# Patient Record
Sex: Female | Born: 1985
Health system: Southern US, Community
[De-identification: ages and names within clinical notes are randomized; demographics above are authoritative.]

## PROBLEM LIST (undated history)

## (undated) DIAGNOSIS — I10 Essential (primary) hypertension: Secondary | ICD-10-CM

## (undated) DIAGNOSIS — U071 COVID-19: Secondary | ICD-10-CM

## (undated) DIAGNOSIS — J45909 Unspecified asthma, uncomplicated: Secondary | ICD-10-CM

## (undated) HISTORY — PX: TOOTH EXTRACTION: SUR596

---

## 2012-09-22 ENCOUNTER — Encounter (HOSPITAL_COMMUNITY): Payer: Self-pay | Admitting: *Deleted

## 2012-09-22 ENCOUNTER — Emergency Department (HOSPITAL_COMMUNITY)
Admission: EM | Admit: 2012-09-22 | Discharge: 2012-09-22 | Disposition: A | Discharge status: Home / Self Care | Payer: Self-pay | Attending: Emergency Medicine | Admitting: Emergency Medicine

## 2012-09-22 DIAGNOSIS — R300 Dysuria: Secondary | ICD-10-CM | POA: Insufficient documentation

## 2012-09-22 DIAGNOSIS — R109 Unspecified abdominal pain: Secondary | ICD-10-CM | POA: Insufficient documentation

## 2012-09-22 DIAGNOSIS — N390 Urinary tract infection, site not specified: Secondary | ICD-10-CM | POA: Insufficient documentation

## 2012-09-22 DIAGNOSIS — Z3202 Encounter for pregnancy test, result negative: Secondary | ICD-10-CM | POA: Insufficient documentation

## 2012-09-22 LAB — URINALYSIS, ROUTINE W REFLEX MICROSCOPIC
Nitrite: NEGATIVE
Protein, ur: 30 mg/dL — AB
Specific Gravity, Urine: 1.015 (ref 1.005–1.030)
Urobilinogen, UA: 1 mg/dL (ref 0.0–1.0)

## 2012-09-22 LAB — URINE MICROSCOPIC-ADD ON

## 2012-09-22 LAB — POCT PREGNANCY, URINE: Preg Test, Ur: NEGATIVE

## 2012-09-22 MED ORDER — CEFTRIAXONE SODIUM 1 G IJ SOLR
1.0000 g | Freq: Once | INTRAMUSCULAR | Status: AC
  Administered 2012-09-22: 1 g via INTRAMUSCULAR
  Filled 2012-09-22: qty 10

## 2012-09-22 MED ORDER — CEPHALEXIN 500 MG PO CAPS: 500.0000 mg | ORAL_CAPSULE | Freq: Two times a day (BID) | ORAL | Status: DC

## 2012-09-22 NOTE — ED Provider Notes (Signed)
History     CSN: [id number]  Arrival date & time 09/22/12  4098   First MD Initiated Contact with Patient 09/22/12 [phone]      No chief complaint on file.   (Consider location/radiation/quality/duration/timing/severity/associated sxs/prior treatment) HPI  Ann Mcmillan is a 27 y.o. female complaining of urinary frequency associated with dysuria worsening over the course of 10 days. Patient states that she has right flank pain onset this a.m. She denies fever, nausea or vomiting. Rates the pain as moderate, 6/10, not  exacerbated by palpation or movement.  No past medical history on file.  No past surgical history on file.  No family history on file.  History  Substance Use Topics  . Smoking status: Not on file  . Smokeless tobacco: Not on file  . Alcohol Use: Not on file    OB History   No data available      Review of Systems  Constitutional: Negative for fever.  Respiratory: Negative for shortness of breath.   Cardiovascular: Negative for chest pain.  Gastrointestinal: Negative for nausea, vomiting, abdominal pain and diarrhea.  Genitourinary: Positive for dysuria, frequency and flank pain.  All other systems reviewed and are negative.    Allergies  Review of patient's allergies indicates not on file.  Home Medications  No current outpatient prescriptions on file.  There were no vitals taken for this visit.  Physical Exam  Nursing note and vitals reviewed. Constitutional: She is oriented to person, place, and time. She appears well-developed and well-nourished. No distress.  HENT:  Head: Normocephalic.  Mouth/Throat: Oropharynx is clear and moist.  Eyes: Conjunctivae and EOM are normal. Pupils are equal, round, and reactive to light.  Neck: Normal range of motion.  Cardiovascular: Normal rate.   Pulmonary/Chest: Effort normal and breath sounds normal. No stridor. No respiratory distress. She has no wheezes. She has no rales. She exhibits no tenderness.   Abdominal: Soft. Bowel sounds are normal. She exhibits no distension and no mass. There is no tenderness. There is no rebound and no guarding.  Genitourinary:  No CVA tenderness bilaterally  Musculoskeletal: Normal range of motion.  Neurological: She is alert and oriented to person, place, and time.  Psychiatric: She has a normal mood and affect.    ED Course  Procedures (including critical care time)  Labs Reviewed  URINALYSIS, ROUTINE W REFLEX MICROSCOPIC - Abnormal; Notable for the following:    APPearance CLOUDY (*)    Hgb urine dipstick TRACE (*)    Protein, ur 30 (*)    Leukocytes, UA LARGE (*)    All other components within normal limits  URINE MICROSCOPIC-ADD ON - Abnormal; Notable for the following:    Bacteria, UA FEW (*)    All other components within normal limits  URINE CULTURE  POCT PREGNANCY, URINE   No results found.   1. UTI (lower urinary tract infection)       MDM   Filed Vitals:   09/22/12 0903  BP: 127/79  Pulse: 102  Temp: 98.6 F (37 C)  Resp: 20  SpO2: 100%     Ann Mcmillan is a 27 y.o. female  with over a week of urinary frequency and dysuria. Patient has no CVA tenderness, she is tolerating by mouth, no concern for pyelonephritis at this point. Urinalysis does appear to be infected. Culture sent, patient will be given a gram of Rocephin and sent home on Keflex. Return precautions for pyelo given.    Medications  cefTRIAXone (ROCEPHIN) injection 1  g (not administered)    The patient is hemodynamically stable, appropriate for, and amenable to, discharge at this time. Pt verbalized understanding and agrees with care plan. Outpatient follow-up and return precautions given.    New Prescriptions   CEPHALEXIN (KEFLEX) 500 MG CAPSULE    Take 1 capsule (500 mg total) by mouth 2 (two) times daily.           Wynetta Emery, PA-C 09/22/12 1002

## 2012-09-22 NOTE — ED Notes (Signed)
Patient states urinary frequency x 1 week, denies pain or burning

## 2012-09-24 LAB — URINE CULTURE: Colony Count: >=100000

## 2012-09-24 NOTE — ED Provider Notes (Signed)
Medical screening examination/treatment/procedure(s) were performed by non-physician practitioner and as supervising physician I was immediately available for consultation/collaboration.   Oneda Duffett E Mahi Zabriskie, MD 09/24/12 1108 

## 2012-09-25 ENCOUNTER — Telehealth (HOSPITAL_COMMUNITY): Payer: Self-pay | Admitting: Emergency Medicine

## 2012-09-25 NOTE — ED Notes (Signed)
Post ED Visit - Positive Culture Follow-up  Culture report reviewed by antimicrobial stewardship pharmacist: [x]  Marlou Sa, Pharm.D., BCPS []  Celedonio Miyamoto, [address].D., BCPS []  Georgina Pillion, Pharm.D., BCPS []  [place], [place].D., BCPS, AAHIVP []  Estella Husk, Pharm.D., BCPS, AAHIVP  Positive urine culture Treated with Keflex, organism sensitive to the same and no further patient follow-up is required at this time.  Kylie A Holland 09/25/2012, 5:19 PM

## 2012-10-24 ENCOUNTER — Emergency Department (HOSPITAL_COMMUNITY)
Admission: EM | Admit: 2012-10-24 | Discharge: 2012-10-24 | Disposition: A | Discharge status: Home / Self Care | Payer: Medicaid Other | Attending: Emergency Medicine | Admitting: Emergency Medicine

## 2012-10-24 ENCOUNTER — Encounter (HOSPITAL_COMMUNITY): Payer: Self-pay | Admitting: *Deleted

## 2012-10-24 DIAGNOSIS — F17200 Nicotine dependence, unspecified, uncomplicated: Secondary | ICD-10-CM | POA: Insufficient documentation

## 2012-10-24 DIAGNOSIS — K0889 Other specified disorders of teeth and supporting structures: Secondary | ICD-10-CM

## 2012-10-24 DIAGNOSIS — R599 Enlarged lymph nodes, unspecified: Secondary | ICD-10-CM | POA: Insufficient documentation

## 2012-10-24 DIAGNOSIS — K0510 Chronic gingivitis, plaque induced: Secondary | ICD-10-CM

## 2012-10-24 DIAGNOSIS — K029 Dental caries, unspecified: Secondary | ICD-10-CM

## 2012-10-24 DIAGNOSIS — K0500 Acute gingivitis, plaque induced: Secondary | ICD-10-CM | POA: Insufficient documentation

## 2012-10-24 DIAGNOSIS — K047 Periapical abscess without sinus: Secondary | ICD-10-CM | POA: Insufficient documentation

## 2012-10-24 DIAGNOSIS — K089 Disorder of teeth and supporting structures, unspecified: Secondary | ICD-10-CM | POA: Insufficient documentation

## 2012-10-24 MED ORDER — PENICILLIN V POTASSIUM 500 MG PO TABS: 500.0000 mg | ORAL_TABLET | Freq: Four times a day (QID) | ORAL | Status: AC

## 2012-10-24 MED ORDER — HYDROCODONE-ACETAMINOPHEN 5-325 MG PO TABS: 1.0000 | ORAL_TABLET | ORAL | Status: DC | PRN

## 2012-10-24 MED ORDER — HYDROCODONE-ACETAMINOPHEN 5-325 MG PO TABS
2.0000 | ORAL_TABLET | Freq: Once | ORAL | Status: AC
  Administered 2012-10-24: 2 via ORAL
  Filled 2012-10-24: qty 2

## 2012-10-24 NOTE — ED Provider Notes (Signed)
History    CSN: [id number] Arrival date & time 10/24/12  0000  First MD Initiated Contact with Patient 10/24/12 0012     Chief Complaint  Patient presents with  . Dental Pain   HPI  History provided by the patient. The patient is a 27 year old female with no significant PMH who is a current every day smoker presenting with complaints of worsened right upper molar dental pain. Patient reports history of intermittent dental pains and problems for many years. Over the last week however she has had gradually worsening and persistent pains primarily to the right upper molar. She feels some associated swelling to the cheek and facial area. She has been using Orajel which initially helped and over-the-counter ibuprofen but this has stopped working. She denies any fever, chills or sweats. No difficulty swallowing or breathing. No other aggravating or alleviating factors. No other associated symptoms.    History reviewed. No pertinent past medical history. History reviewed. No pertinent past surgical history. No family history on file. History  Substance Use Topics  . Smoking status: Current Every Day Smoker  . Smokeless tobacco: Not on file  . Alcohol Use: No   OB History   Grav Para Term Preterm Abortions TAB SAB Ect Mult Living                 Review of Systems  Constitutional: Negative for fever, chills and diaphoresis.  HENT: Positive for dental problem. Negative for sore throat and trouble swallowing.   All other systems reviewed and are negative.    Allergies  Review of patient's allergies indicates no known allergies.  Home Medications   Current Outpatient Rx  Name  Route  Sig  Dispense  Refill  . ibuprofen (ADVIL,MOTRIN) 200 MG tablet   Oral   Take 800 mg by mouth every 6 (six) hours as needed for pain.         Marland Kitchen HYDROcodone-acetaminophen (NORCO) 5-325 MG per tablet   Oral   Take 1 tablet by mouth every 4 (four) hours as needed for pain.   10 tablet   0   .  penicillin v potassium (VEETID) 500 MG tablet   Oral   Take 1 tablet (500 mg total) by mouth 4 (four) times daily.   40 tablet   0    BP 140/88  Pulse 97  Temp(Src) 98.6 F (37 C)  Resp 22  SpO2 98% Physical Exam  Nursing note and vitals reviewed. Constitutional: She is oriented to person, place, and time. She appears well-developed and well-nourished. No distress.  HENT:  Head: Normocephalic.  Multiple dental caries without with dental decay primarily to the molar teeth. There is erythema and slight edema around the gums of the right upper molar teeth with significant decay and tenderness to percussion of the right upper second molar. We'll dental caries throughout. No swelling of the tongue. Oropharynx clear.  Neck: Normal range of motion. Neck supple.  Right-sided anterior cervical lymphadenopathy  Cardiovascular: Normal rate and regular rhythm.   Pulmonary/Chest: Effort normal and breath sounds normal. No respiratory distress. She has no wheezes. She has no rales.  Abdominal: Soft.  Musculoskeletal: Normal range of motion.  Lymphadenopathy:    She has cervical adenopathy.  Neurological: She is alert and oriented to person, place, and time.  Skin: Skin is warm and dry. No rash noted.  Psychiatric: She has a normal mood and affect. Her behavior is normal.    ED Course  Procedures  1. Pain, dental   2. Periapical abscess   3. Gingivitis   4. Dental caries     MDM  1:20 AM patient seen and evaluated. Patient holding right jaw and face appears uncomfortable but in no acute distress.  Angus Seller, PA-C 10/24/12 [phone]

## 2012-10-24 NOTE — ED Provider Notes (Signed)
Medical screening examination/treatment/procedure(s) were performed by non-physician practitioner and as supervising physician I was immediately available for consultation/collaboration.    Christopher J. Pollina, MD 10/24/12 0340 

## 2012-10-24 NOTE — ED Notes (Signed)
Toothache since last thursday

## 2013-08-31 ENCOUNTER — Emergency Department (HOSPITAL_COMMUNITY)
Admission: EM | Admit: 2013-08-31 | Discharge: 2013-08-31 | Disposition: A | Discharge status: Home / Self Care | Payer: Medicaid Other | Attending: Emergency Medicine | Admitting: Emergency Medicine

## 2013-08-31 ENCOUNTER — Encounter (HOSPITAL_COMMUNITY): Payer: Self-pay | Admitting: Emergency Medicine

## 2013-08-31 DIAGNOSIS — N390 Urinary tract infection, site not specified: Secondary | ICD-10-CM

## 2013-08-31 DIAGNOSIS — K002 Abnormalities of size and form of teeth: Secondary | ICD-10-CM | POA: Insufficient documentation

## 2013-08-31 DIAGNOSIS — K029 Dental caries, unspecified: Secondary | ICD-10-CM | POA: Insufficient documentation

## 2013-08-31 DIAGNOSIS — H9209 Otalgia, unspecified ear: Secondary | ICD-10-CM | POA: Insufficient documentation

## 2013-08-31 DIAGNOSIS — K089 Disorder of teeth and supporting structures, unspecified: Secondary | ICD-10-CM | POA: Insufficient documentation

## 2013-08-31 DIAGNOSIS — F17200 Nicotine dependence, unspecified, uncomplicated: Secondary | ICD-10-CM | POA: Insufficient documentation

## 2013-08-31 DIAGNOSIS — K0889 Other specified disorders of teeth and supporting structures: Secondary | ICD-10-CM

## 2013-08-31 DIAGNOSIS — K0381 Cracked tooth: Secondary | ICD-10-CM | POA: Insufficient documentation

## 2013-08-31 LAB — URINE MICROSCOPIC-ADD ON

## 2013-08-31 LAB — URINALYSIS, ROUTINE W REFLEX MICROSCOPIC
Bilirubin Urine: NEGATIVE
GLUCOSE, UA: NEGATIVE mg/dL
Ketones, ur: NEGATIVE mg/dL
Nitrite: NEGATIVE
Protein, ur: NEGATIVE mg/dL
SPECIFIC GRAVITY, URINE: 1.008 (ref 1.005–1.030)
UROBILINOGEN UA: 0.2 mg/dL (ref 0.0–1.0)
pH: 6 (ref 5.0–8.0)

## 2013-08-31 MED ORDER — PENICILLIN V POTASSIUM 250 MG PO TABS
500.0000 mg | ORAL_TABLET | Freq: Once | ORAL | Status: AC
  Administered 2013-08-31: 500 mg via ORAL
  Filled 2013-08-31: qty 2

## 2013-08-31 MED ORDER — PENICILLIN V POTASSIUM 500 MG PO TABS: 500.0000 mg | ORAL_TABLET | Freq: Four times a day (QID) | ORAL | Status: DC

## 2013-08-31 MED ORDER — HYDROCODONE-ACETAMINOPHEN 5-325 MG PO TABS
2.0000 | ORAL_TABLET | Freq: Once | ORAL | Status: AC
  Administered 2013-08-31: 2 via ORAL
  Filled 2013-08-31: qty 2

## 2013-08-31 MED ORDER — FLUCONAZOLE 150 MG PO TABS: 150.0000 mg | ORAL_TABLET | Freq: Once | ORAL | Status: DC

## 2013-08-31 MED ORDER — SULFAMETHOXAZOLE-TRIMETHOPRIM 800-160 MG PO TABS: 1.0000 | ORAL_TABLET | Freq: Two times a day (BID) | ORAL | Status: DC

## 2013-08-31 MED ORDER — PHENAZOPYRIDINE HCL 200 MG PO TABS: 200.0000 mg | ORAL_TABLET | Freq: Three times a day (TID) | ORAL | Status: DC

## 2013-08-31 MED ORDER — HYDROCODONE-ACETAMINOPHEN 5-325 MG PO TABS: 1.0000 | ORAL_TABLET | ORAL | Status: DC | PRN

## 2013-08-31 NOTE — ED Provider Notes (Signed)
Medical screening examination/treatment/procedure(s) were performed by non-physician practitioner and as supervising physician I was immediately available for consultation/collaboration.   EKG Interpretation None        Junius ArgyleForrest S Avante Carneiro, MD 08/31/13 2011

## 2013-08-31 NOTE — Discharge Instructions (Signed)
Take Veetid as directed for your dental pain. Take vicodin as needed for pain. Take Bactrim as needed for UTI. Take pyridium as needed for bladder pain. Take Diflucan for your yeast infection. Follow up with a dentist from the resource guide below.    Emergency Department Resource Guide 1) Find a Doctor and Pay Out of Pocket Although you won't have to find out who is covered by your insurance plan, it is a good idea to ask around and get recommendations. You will then need to call the office and see if the doctor you have chosen will accept you as a new patient and what types of options they offer for patients who are self-pay. Some doctors offer discounts or will set up payment plans for their patients who do not have insurance, but you will need to ask so you aren't surprised when you get to your appointment.  2) Contact Your Local Health Department Not all health departments have doctors that can see patients for sick visits, but many do, so it is worth a call to see if yours does. If you don't know where your local health department is, you can check in your phone book. The CDC also has a tool to help you locate your state's health department, and many state websites also have listings of all of their local health departments.  3) Find a Walk-in Clinic If your illness is not likely to be very severe or complicated, you may want to try a walk in clinic. These are popping up all over the country in pharmacies, drugstores, and shopping centers. They're usually staffed by nurse practitioners or physician assistants that have been trained to treat common illnesses and complaints. They're usually fairly quick and inexpensive. However, if you have serious medical issues or chronic medical problems, these are probably not your best option.  No Primary Care Doctor: - Call Health Connect at  [phone] - they can help you locate a primary care doctor that  accepts your insurance, provides certain services,  etc. - Physician Referral Service- [phone]  Chronic Pain Problems: Organization         Address  Phone   Notes  [facility]  [phone] Patients need to be referred by their primary care doctor.   Medication Assistance: Organization         Address  Phone   Notes  [facility] [address] [place], [place] [address] [phone] --Must be a resident of [place] -- Must have NO insurance coverage whatsoever (no Medicaid/ Medicare, etc.) -- The pt. MUST have a primary care doctor that directs their care regularly and follows them in the community   MedAssist  [phone]   [employer]  [phone]    Agencies that provide inexpensive medical care: Organization         Address  Phone   Notes  [facility]  [phone]   [facility]    [phone]   [facility] [address] [place], [place] [address] [phone]   [facility] [address], [place] [phone]   Planned Parenthood    [phone]   [facility]    [phone]   [facility]  [address], [place] Phone:  [phone], Fax:  [phone] Hours of Operation:  9 am - 6 pm, M-F.  Also accepts Medicaid/Medicare and self-pay.  [facility] for [place] [address] [place], [address], [place] Phone: [phone], Fax: [phone]. Hours of Operation:  8:30 am - 5:30 pm, M-F.  Also accepts Medicaid and self-pay.  [facility] [place] [address] [place] Phone: [phone]   [place], [place], [place] [phone], Ext. [phone] Mondays & Thursdays: 7-9 AM.  First 15 patients are seen on a first come, first serve basis.    [place] Providers:  Organization         Address  Phone   Notes  [facility] [address],  [phone] Also accepts self-pay patients.  [facility] [address], [place]  [phone]   [place], [address], [place] [phone]   [facility] [address] [place] [phone]   Lucianne Lei [address], [place]   [phone] Only accepts [place] Access [place] patients after they have their name applied to their card.   Self-Pay (no insurance) in [facility]:  Organization         Address  Phone   Notes  Sickle Cell Patients, [facility] [place] [phone]   [facility] [place] [phone]   [facility] [place]  [place], [facility], [place] [phone]   [facility]/Dr. Osei-Bonsu  [address], [place] or [address], [place] [phone] Phone number for both [place] and [place] locations is the same.  Urgent Medical and [facility] [address] [place] [phone]   [facility] [address] [place] or [address] [phone]   [facility] [address], [place] [phone], phone; [phone], fax Sees patients 1st and 3rd Saturday of every month.  Must not qualify for public or private insurance (i.e. Medicaid, Medicare, Becker Health Choice, Veterans' Benefits)  Household income should be no more than 200% of the poverty level The clinic cannot treat you if you are pregnant or think you are pregnant  Sexually transmitted diseases are not treated at the clinic.    Dental Care: Organization         Address  Phone  Notes  [facility] Department of [facility] [phone] Accepts children up to  age 51 who are enrolled in [place] or [place]; pregnant women with a Medicaid card; and children who have applied for Medicaid or [place] Health Choice, but were declined, whose parents can pay a reduced fee at time of service.  [facility] Department of [facility]  [address], [place] [phone] Accepts children up to age 24 who are enrolled in [place] or [place]; pregnant women with a Medicaid card; and children who have applied for Medicaid or [place] Health Choice, but were declined, whose parents can pay a reduced fee at time of service.  [place] Adult Dental Access PROGRAM  [place] [phone] Patients are seen by appointment only. Walk-ins are not accepted. [place] will see patients 54 years of age and older. Monday - Tuesday (  8am-5pm) Most Wednesdays (8:30-5pm) $30 per visit, cash only  [facility] Adult Dental Access PROGRAM  [address], [facility] [phone] Patients are seen by appointment only. Walk-ins are not accepted. [place] will see patients 2 years of age and older. One Wednesday Evening (Monthly: Volunteer Based).  $30 per visit, cash only  [place]  [phone] for adults; Children under age 70, call Graduate Pediatric Dentistry at [phone]. Children aged 26-14, please call [phone] to request a pediatric application.  Dental services are provided in all areas of dental care including fillings, crowns and bridges, complete and partial dentures, implants, gum treatment, root canals, and extractions. Preventive care is also provided. Treatment is provided to both adults and children. Patients are selected via a lottery and there is often a waiting list.   [facility] [address], [place]  [phone] [email or web address]   [facility] [address] [place], [place] [phone], Ext. [phone] Second and Fourth Thursday of  each month, opens at 6:30 AM; Clinic ends at 9 AM.  Patients are seen on a first-come first-served basis, and a limited number are seen during each clinic.   [facility]  [address] Hillard Danker [place], [place] [phone]   Eligibility Requirements You must have lived in [place], [place], or [place] counties for at least the last three months.   You cannot be eligible for state or federal sponsored [employer], including [employer], [place], or [employer].   You generally cannot be eligible for healthcare insurance through your employer.    How to apply: Eligibility screenings are held every Tuesday and Wednesday afternoon from 1:00 pm until 4:00 pm. You do not need an appointment for the interview!  [facility] [address], [place], [place]   [place]  Grants Department  [place]  [phone]    Behavioral Health Resources in the Community: Intensive Outpatient Programs Organization         Address  Phone  Notes  [place]. [address] [place], [place] [phone]   [facility] Outpatient [address], [place], [place]   ADS: Alcohol & Drug Svcs [address] [place], [place]   [place] [address],  [place], [place] or [phone]   Substance Abuse Resources Organization         Address  Phone  Notes  Alcohol and Drug Services  [phone]   [place]  [phone]   [facility] [place]   Chinita Pester  [phone]   Residential & Outpatient Substance Abuse Program  [phone]   Psychological Services Organization         Address  Phone  Notes  [facility] [place]  [place]  [phone]   [place] [address]  [place] or [phone]    Mobile Crisis Teams Organization         Address  Phone  Notes  [facility]atives, Mobile Crisis Care Unit  [phone]   [facility]  [address] [place], [place]   [place] [address], [place] [phone]    Self-Help/Support Groups Organization         Address  Phone  Notes  [facility] of [place] - variety of support groups  [place] Call for more information  Narcotics Anonymous (NA), [facility]aring [facility] [address], [employer] [place]  2 meetings at this location   Special educational needs teacher         Address  Phone  Notes  ASAP Residential Treatment [place],    [place]  [phone]   [facility]  [address], [place] [phone], [place], [place]   [place], [place] [phone] Admissions: 8am-3pm M-F  Incentives Substance [place] [address],    [place], [place] [phone]   [facility] [address] [place], [place], [place]   [facility] [address]  [place], [place]   [facility] Programs - Intensive Outpatient [address], [place], [place]   [facility] ([place].,  [place], [place] [phone] or [phone]   [facility]idential Treatment Services (RTS) [address] [place], [place] Accepts Medicaid  [facility] [place] [address],  [place] [phone] Substance Abuse/Addiction Treatment   [facility] Organization         Address  Phone  Notes  [facility]  [phone]   Domenic Schwab, PhD [address] Arlis [place], [place]   [phone] or [phone]   [place], [place] [phone]     [facility] [address], [place], [place] [phone] Insurance/Medicaid/sponsorship through [facility] and Families [address], [address]                                    [place], [place] [phone] [place] [address], [place] [phone]    Dr. Adele Schilder  [phone]   [facility] Dept. 1) [address], [place] 2) [place] 3)  [address], [place] [phone]  [phone]   [place] [phone] or [phone] (After Hours)

## 2013-08-31 NOTE — ED Notes (Signed)
Dysuria x 2 days and blood in ua aslo rt upper and rt lower tooth pain x 1 week

## 2013-08-31 NOTE — ED Provider Notes (Signed)
CSN: [id number]     Arrival date & time 08/31/13  0927 History  This chart was scribed for non-physician practitioner, Ann BeckKaitlyn Jonthan Leite, PA-C working with Junius ArgyleForrest S Harrison, MD by Greggory StallionKayla Andersen, ED scribe. This patient was seen in room TR05C/TR05C and the patient's care was started at 10:16 AM.   Chief Complaint  Patient presents with  . Dysuria  . Dental Pain   The history is provided by the patient. No language interpreter was used.   HPI Comments: Ann Mcmillan is a 28 y.o. female who presents to the Emergency Department complaining of gradual onset right upper and lower dental pain that started one week ago. States pain radiates into her ear. pt has had several cracked teeth in the past and never got them pulled. She has taken ibuprofen with no relief. Pt is also complaining of dysuria, frequency and hematuria that started 3-4 days ago. She has taken AZO pills with no relief. Denies fever.   History reviewed. No pertinent past medical history. History reviewed. No pertinent past surgical history. No family history on file. History  Substance Use Topics  . Smoking status: Current Every Day Smoker  . Smokeless tobacco: Not on file  . Alcohol Use: No   OB History   Grav Para Term Preterm Abortions TAB SAB Ect Mult Living                 Review of Systems  Constitutional: Negative for fever.  HENT: Positive for dental problem and ear pain.   Genitourinary: Positive for dysuria, frequency and hematuria.  All other systems reviewed and are negative.  Allergies  Review of patient's allergies indicates no known allergies.  Home Medications   Prior to Admission medications   Medication Sig Start Date End Date Taking? Authorizing Provider  HYDROcodone-acetaminophen (NORCO) 5-325 MG per tablet Take 1 tablet by mouth every 4 (four) hours as needed for pain. 10/24/12   Phill MutterPeter S Dammen, PA-C  ibuprofen (ADVIL,MOTRIN) 200 MG tablet Take 800 mg by mouth every 6 (six) hours as needed for  pain.    Historical Provider, MD   BP 156/86  Pulse 78  Temp(Src) 98.8 F (37.1 C) (Oral)  Resp 15  Ht 5\' 2"  (1.575 m)  Wt 144 lb (65.318 kg)  BMI 26.33 kg/m2  SpO2 100%  Physical Exam  Nursing note and vitals reviewed. Constitutional: She is oriented to person, place, and time. She appears well-developed and well-nourished. No distress.  HENT:  Head: Normocephalic and atraumatic.  Poor dentition. Multiple dental caries noted. Right upper molar cracked and tender to percussion. No abscess.   Eyes: EOM are normal.  Neck: Neck supple. No tracheal deviation present.  Cardiovascular: Normal rate.   Pulmonary/Chest: Effort normal. No respiratory distress.  Abdominal: Soft. There is tenderness.  Suprapubic tenderness to palpation.   Musculoskeletal: Normal range of motion.  Neurological: She is alert and oriented to person, place, and time.  Skin: Skin is warm and dry.  Psychiatric: She has a normal mood and affect. Her behavior is normal.    ED Course  Procedures (including critical care time)  DIAGNOSTIC STUDIES: Oxygen Saturation is 100% on RA, normal by my interpretation.    COORDINATION OF CARE: 10:18 AM-Discussed treatment plan which includes two antbiotics, pain medications and pyridium with pt at bedside and pt agreed to plan. Will give pt dental referrals and advised her to follow up.   Labs Review Labs Reviewed  URINALYSIS, ROUTINE W REFLEX MICROSCOPIC - Abnormal; Notable for the  following:    APPearance HAZY (*)    Hgb urine dipstick MODERATE (*)    Leukocytes, UA LARGE (*)    All other components within normal limits  URINE MICROSCOPIC-ADD ON - Abnormal; Notable for the following:    Squamous Epithelial / LPF MANY (*)    Bacteria, UA MANY (*)    All other components within normal limits    Imaging Review No results found.   EKG Interpretation None      MDM   Final diagnoses:  Pain, dental  UTI (urinary tract infection)    10:25 AM Patient will  have Bactrim for UTI and pyridium for bladder spasm. Patient will have Vicodin and Veetid for dental pain. Patient requested diflucan for frequent yeast infections with antibiotics. Vitals stable and patient afebrile. Patient advised to follow up with dentist from resource guide.   I personally performed the services described in this documentation, which was scribed in my presence. The recorded information has been reviewed and is accurate.  Ann BeckKaitlyn Donella Pascarella, PA-C 08/31/13 1027

## 2014-02-11 ENCOUNTER — Emergency Department (HOSPITAL_COMMUNITY)
Admission: EM | Admit: 2014-02-11 | Discharge: 2014-02-11 | Disposition: A | Discharge status: Home / Self Care | Payer: Medicaid Other | Attending: Emergency Medicine | Admitting: Emergency Medicine

## 2014-02-11 ENCOUNTER — Encounter (HOSPITAL_COMMUNITY): Payer: Self-pay | Admitting: Emergency Medicine

## 2014-02-11 DIAGNOSIS — K0889 Other specified disorders of teeth and supporting structures: Secondary | ICD-10-CM

## 2014-02-11 DIAGNOSIS — R51 Headache: Secondary | ICD-10-CM | POA: Insufficient documentation

## 2014-02-11 DIAGNOSIS — K088 Other specified disorders of teeth and supporting structures: Secondary | ICD-10-CM | POA: Insufficient documentation

## 2014-02-11 DIAGNOSIS — Z720 Tobacco use: Secondary | ICD-10-CM | POA: Insufficient documentation

## 2014-02-11 DIAGNOSIS — Z9889 Other specified postprocedural states: Secondary | ICD-10-CM | POA: Insufficient documentation

## 2014-02-11 MED ORDER — ONDANSETRON 4 MG PO TBDP
8.0000 mg | ORAL_TABLET | Freq: Once | ORAL | Status: AC
  Administered 2014-02-11: 8 mg via ORAL
  Filled 2014-02-11: qty 2

## 2014-02-11 MED ORDER — OXYCODONE-ACETAMINOPHEN 5-325 MG PO TABS
1.0000 | ORAL_TABLET | Freq: Once | ORAL | Status: AC
  Administered 2014-02-11: 1 via ORAL
  Filled 2014-02-11: qty 1

## 2014-02-11 MED ORDER — TRAMADOL HCL 50 MG PO TABS: 50.0000 mg | ORAL_TABLET | Freq: Four times a day (QID) | ORAL | Status: DC | PRN

## 2014-02-11 MED ORDER — PENICILLIN V POTASSIUM 500 MG PO TABS: 500.0000 mg | ORAL_TABLET | Freq: Three times a day (TID) | ORAL | Status: DC

## 2014-02-11 MED ORDER — NAPROXEN 375 MG PO TABS: 375.0000 mg | ORAL_TABLET | Freq: Two times a day (BID) | ORAL | Status: DC

## 2014-02-11 MED ORDER — IBUPROFEN 400 MG PO TABS
400.0000 mg | ORAL_TABLET | Freq: Once | ORAL | Status: AC
  Administered 2014-02-11: 400 mg via ORAL
  Filled 2014-02-11: qty 1

## 2014-02-11 NOTE — ED Notes (Signed)
C/o R upper toothache, radiates to R face ear and head. H/o similar. Reports fractured teeth and extracted teeth. Does not have dentist. Onset 3d ago. No meds in last 6 hrs. No relief with tylenol, BC or ibuprofen. (denies: vomiting, drainage or fever).

## 2014-02-11 NOTE — ED Provider Notes (Signed)
CSN: [id number]     Arrival date & time 02/11/14  [id number] History   First MD Initiated Contact with Patient 02/11/14 [phone]     Chief Complaint  Patient presents with  . Dental Pain     (Consider location/radiation/quality/duration/timing/severity/associated sxs/prior Treatment) HPI Comments: 28 year old female with history of poor dentition, smoker presents with right upper tooth ache that radiates to the right ear and head. Patient has history of fractured teeth, extracted teeth and infection. Patient does not have a dentist. Symptoms have been worsening gradually for 3 days, worse with palpation. Patient denies fever or neck stiffness.  Patient is a 28 y.o. female presenting with tooth pain. The history is provided by the patient.  Dental Pain Associated symptoms: headaches     History reviewed. No pertinent past medical history. Past Surgical History  Procedure Laterality Date  . Tooth extraction     No family history on file. History  Substance Use Topics  . Smoking status: Current Every Day Smoker  . Smokeless tobacco: Not on file  . Alcohol Use: Yes   OB History   Grav Para Term Preterm Abortions TAB SAB Ect Mult Living                 Review of Systems  HENT: Positive for dental problem.   Skin: Negative for rash.  Neurological: Positive for headaches. Negative for light-headedness.      Allergies  Review of patient's allergies indicates no known allergies.  Home Medications   Prior to Admission medications   Medication Sig Start Date End Date Taking? Authorizing Provider  naproxen (NAPROSYN) 375 MG tablet Take 1 tablet (375 mg total) by mouth 2 (two) times daily. 02/11/14   Enid SkeensJoshua M Kacy Hegna, MD  penicillin v potassium (VEETID) 500 MG tablet Take 1 tablet (500 mg total) by mouth 3 (three) times daily. 02/11/14   Enid SkeensJoshua M Luellen Howson, MD  traMADol (ULTRAM) 50 MG tablet Take 1 tablet (50 mg total) by mouth every 6 (six) hours as needed. 02/11/14   Enid SkeensJoshua M Aarron Wierzbicki, MD    BP 128/98  Pulse 76  Temp(Src) 98.2 F (36.8 C) (Oral)  Resp 24  Ht 5\' 2"  (1.575 m)  Wt 137 lb (62.143 kg)  BMI 25.05 kg/m2  SpO2 100%  LMP 02/05/2014 Physical Exam  Nursing note and vitals reviewed. Constitutional: She appears well-developed and well-nourished. No distress.  HENT:  Head: Normocephalic.  Patient has no trismus, patient has poor dentition, fractured right upper posterior molar with tenderness to gingiva superiorly, no focal abscess palpated.  Neck: Normal range of motion. Neck supple.  Pulmonary/Chest: Effort normal.    ED Course  Procedures (including critical care time) Labs Review Labs Reviewed - No data to display  Imaging Review No results found.   EKG Interpretation None      MDM   Final diagnoses:  Pain, dental   Patient with poor dentition presents with right upper tooth pain. No signs of abscess, no trismus, no signs of emergent infection. Discussed antibiotics, pain meds and follow-up with a dentist.  Results and differential diagnosis were discussed with the patient/parent/guardian. Close follow up outpatient was discussed, comfortable with the plan.   Medications  oxyCODONE-acetaminophen (PERCOCET/ROXICET) 5-325 MG per tablet 1 tablet (not administered)  ibuprofen (ADVIL,MOTRIN) tablet 400 mg (400 mg Oral Given 02/11/14 0525)  oxyCODONE-acetaminophen (PERCOCET/ROXICET) 5-325 MG per tablet 1 tablet (1 tablet Oral Given 02/11/14 0525)  ondansetron (ZOFRAN-ODT) disintegrating tablet 8 mg (8 mg Oral Given 02/11/14 0525)  Filed Vitals:   02/11/14 0429 02/11/14 0526 02/11/14 0636  BP: 138/95  128/98  Pulse: 73  76  Temp: 98.2 F (36.8 C)    TempSrc: Oral    Resp: 26  24  Height: 5\' 2"  (1.575 m)    Weight: 137 lb (62.143 kg)    SpO2: 100% 100% 100%    Final diagnoses:  Pain, dental      Enid SkeensJoshua M Theia Dezeeuw, MD 02/11/14 [phone]

## 2014-02-11 NOTE — ED Notes (Signed)
Request to have numbing medications

## 2014-02-20 ENCOUNTER — Emergency Department (HOSPITAL_COMMUNITY)
Admission: EM | Admit: 2014-02-20 | Discharge: 2014-02-20 | Disposition: A | Discharge status: Home / Self Care | Payer: Medicaid Other | Attending: Emergency Medicine | Admitting: Emergency Medicine

## 2014-02-20 ENCOUNTER — Encounter (HOSPITAL_COMMUNITY): Payer: Self-pay | Admitting: Emergency Medicine

## 2014-02-20 DIAGNOSIS — K0889 Other specified disorders of teeth and supporting structures: Secondary | ICD-10-CM

## 2014-02-20 DIAGNOSIS — K088 Other specified disorders of teeth and supporting structures: Secondary | ICD-10-CM | POA: Insufficient documentation

## 2014-02-20 DIAGNOSIS — Z720 Tobacco use: Secondary | ICD-10-CM | POA: Insufficient documentation

## 2014-02-20 MED ORDER — PENICILLIN V POTASSIUM 500 MG PO TABS: 500.0000 mg | ORAL_TABLET | Freq: Four times a day (QID) | ORAL | Status: DC

## 2014-02-20 MED ORDER — BUPIVACAINE HCL (PF) 0.5 % IJ SOLN
10.0000 mL | Freq: Once | INTRAMUSCULAR | Status: AC
  Administered 2014-02-20: 10 mL
  Filled 2014-02-20: qty 10

## 2014-02-20 MED ORDER — HYDROCODONE-ACETAMINOPHEN 5-325 MG PO TABS
2.0000 | ORAL_TABLET | Freq: Once | ORAL | Status: AC
  Administered 2014-02-20: 2 via ORAL
  Filled 2014-02-20: qty 2

## 2014-02-20 MED ORDER — HYDROCODONE-ACETAMINOPHEN 5-325 MG PO TABS: 1.0000 | ORAL_TABLET | ORAL | Status: DC | PRN

## 2014-02-20 NOTE — Discharge Instructions (Signed)
Take Vicodin as needed for pain. Take veetid as directed until gone. Refer to attached documents for more information.

## 2014-02-20 NOTE — ED Provider Notes (Signed)
CSN: [id number]     Arrival date & time 02/20/14  1328 History  This chart was scribed for non-physician practitioner, Emilia BeckKaitlyn Keanan Melander, PA-C, working with Joya Gaskinsonald W Wickline, MD by Milly JakobJohn Lee Graves, ED Scribe. The patient was seen in room TR09C/TR09C. Patient's care was started at 2:34 PM.  Chief Complaint  Patient presents with  . Dental Pain   Patient is a 28 y.o. female presenting with tooth pain. The history is provided by the patient. No language interpreter was used.  Dental Pain Location:  Upper Quality:  Aching Severity:  Severe Onset quality:  Gradual Duration:  7 days Timing:  Constant Progression:  Worsening Chronicity:  Recurrent Relieved by:  Nothing Worsened by:  Nothing tried Ineffective treatments:  NSAIDs Associated symptoms: no fever    HPI Comments: Ann Mcmillan is a 28 y.o. female who presents to the Emergency Department complaining of constant severe, right, upper, dental pain onset 1 week ago. She reports that she was seen here for a similar problem two weeks ago, and prescribed ABX, Tramadol, and Naproxen. She has been taking these medications as prescribed without relief. She reports that she is scheduled to see a dentist on November 23rd, but is requesting something to relieve her pain until then.    History reviewed. No pertinent past medical history. Past Surgical History  Procedure Laterality Date  . Tooth extraction     No family history on file. History  Substance Use Topics  . Smoking status: Current Every Day Smoker  . Smokeless tobacco: Not on file  . Alcohol Use: Yes   OB History    No data available     Review of Systems  Constitutional: Negative for fever.  HENT: Positive for dental problem.   All other systems reviewed and are negative.  Allergies  Review of patient's allergies indicates no known allergies.  Home Medications   Prior to Admission medications   Medication Sig Start Date End Date Taking? Authorizing Provider   naproxen (NAPROSYN) 375 MG tablet Take 1 tablet (375 mg total) by mouth 2 (two) times daily. 02/11/14   Enid SkeensJoshua M Zavitz, MD  penicillin v potassium (VEETID) 500 MG tablet Take 1 tablet (500 mg total) by mouth 3 (three) times daily. 02/11/14   Enid SkeensJoshua M Zavitz, MD  traMADol (ULTRAM) 50 MG tablet Take 1 tablet (50 mg total) by mouth every 6 (six) hours as needed. 02/11/14   Enid SkeensJoshua M Zavitz, MD   Triage Vitals: BP 145/87 mmHg  Pulse 76  Temp(Src) 98.2 F (36.8 C) (Oral)  Resp 17  Ht 5\' 2"  (1.575 m)  Wt 137 lb (62.143 kg)  BMI 25.05 kg/m2  SpO2 96%  LMP 02/05/2014 Physical Exam  Constitutional: She is oriented to person, place, and time. She appears well-developed and well-nourished. No distress.  HENT:  Head: Normocephalic and atraumatic.  Mouth/Throat: Oropharynx is clear and moist. No oropharyngeal exudate.  Poor dentition. Right upper molars cracked and decayed and tender to percussion. No abscess noted.   Eyes: Conjunctivae and EOM are normal.  Neck: Neck supple. No tracheal deviation present.  Cardiovascular: Normal rate.   Pulmonary/Chest: Effort normal. No respiratory distress.  Abdominal: Soft.  Musculoskeletal: Normal range of motion.  Neurological: She is alert and oriented to person, place, and time.  Skin: Skin is warm and dry.  Psychiatric: She has a normal mood and affect. Her behavior is normal.  Nursing note and vitals reviewed.   ED Course  NERVE BLOCK Date/Time: 02/20/2014 3:09 PM Performed by:  Keishaun Hazel Authorized by: Emilia BeckSZEKALSKI, Eilan Mcinerny Consent: Verbal consent obtained. Risks and benefits: risks, benefits and alternatives were discussed Consent given by: patient Patient understanding: patient states understanding of the procedure being performed Patient consent: the patient's understanding of the procedure matches consent given Patient identity confirmed: verbally with patient Time out: Immediately prior to procedure a "time out" was called to  verify the correct patient, procedure, equipment, support staff and site/side marked as required. Indications: pain relief Body area: face/mouth Nerve: posterior superior alveolar Laterality: right Patient sedated: no Preparation: Patient was prepped and draped in the usual sterile fashion. Patient position: sitting Needle gauge: 27 G Location technique: anatomical landmarks Local anesthetic: bupivacaine 0.5% without epinephrine Anesthetic total: 3 ml Outcome: pain improved Patient tolerance: Patient tolerated the procedure well with no immediate complications   (including critical care time) DIAGNOSTIC STUDIES: Oxygen Saturation is 96% on room air, normal by my interpretation.    COORDINATION OF CARE: 2:48 PM-Discussed treatment plan which includes dental block and pain medication.   Labs Review Labs Reviewed - No data to display  Imaging Review No results found.   EKG Interpretation None      MDM   Final diagnoses:  Pain, dental    3:08 PM Patient given dental block for pain relief. Patient reports improved symptoms. Patient will have vicodin and veetid for symptoms. No abscess or signs of ludwigs angina. Patient instructed to return with worsening or concerning symptoms. Vitals stable and patient afebrile.   I personally performed the services described in this documentation, which was scribed in my presence. The recorded information has been reviewed and is accurate.   Emilia BeckKaitlyn Harriette Tovey, PA-C 02/20/14 1511

## 2014-02-20 NOTE — ED Notes (Signed)
Patient states R upper tooth pain.  Patient states was recently seen for same, was unable to see dentist until November 23.   Patient states that she has appointment set up for Nov 23.   Patient complains of "too much pain, it makes my eye jump".

## 2016-07-28 ENCOUNTER — Encounter (HOSPITAL_COMMUNITY): Payer: Self-pay | Admitting: Emergency Medicine

## 2016-07-28 ENCOUNTER — Emergency Department (HOSPITAL_COMMUNITY)
Admission: EM | Admit: 2016-07-28 | Discharge: 2016-07-28 | Disposition: A | Discharge status: Home / Self Care | Payer: Medicaid Other | Attending: Emergency Medicine | Admitting: Emergency Medicine

## 2016-07-28 DIAGNOSIS — J45909 Unspecified asthma, uncomplicated: Secondary | ICD-10-CM | POA: Insufficient documentation

## 2016-07-28 DIAGNOSIS — J019 Acute sinusitis, unspecified: Secondary | ICD-10-CM | POA: Insufficient documentation

## 2016-07-28 DIAGNOSIS — F17200 Nicotine dependence, unspecified, uncomplicated: Secondary | ICD-10-CM | POA: Insufficient documentation

## 2016-07-28 HISTORY — DX: Unspecified asthma, uncomplicated: J45.909

## 2016-07-28 LAB — RAPID STREP SCREEN (MED CTR MEBANE ONLY): Streptococcus, Group A Screen (Direct): NEGATIVE

## 2016-07-28 MED ORDER — HYDROCODONE-ACETAMINOPHEN 7.5-325 MG/15ML PO SOLN: 15.0000 mL | Freq: Every evening | ORAL | 0 refills | Status: DC | PRN

## 2016-07-28 MED ORDER — AMOXICILLIN-POT CLAVULANATE 875-125 MG PO TABS
1.0000 | ORAL_TABLET | Freq: Once | ORAL | Status: AC
  Administered 2016-07-28: 1 via ORAL
  Filled 2016-07-28: qty 1

## 2016-07-28 MED ORDER — AMOXICILLIN-POT CLAVULANATE 875-125 MG PO TABS: 1.0000 | ORAL_TABLET | Freq: Two times a day (BID) | ORAL | 0 refills | Status: DC

## 2016-07-28 MED ORDER — LORATADINE 10 MG PO TABS: 10.0000 mg | ORAL_TABLET | Freq: Every day | ORAL | 0 refills | Status: DC

## 2016-07-28 NOTE — ED Provider Notes (Signed)
[facility]-EMERGENCY DEPT Provider Note   CSN: [id number] Arrival date & time: 07/28/16  0107     History   Chief Complaint Chief Complaint  Patient presents with  . Sore Throat  . Otalgia    HPI Ann Mcmillan is a 31 y.o. female.  The history is provided by the patient. No language interpreter was used.  URI   This is a new problem. The current episode started more than 1 week ago. The problem has not changed since onset.There has been no fever. Associated symptoms include congestion, plugged ear sensation (with otalgia, left), sinus pain, sore throat and swollen glands. Pertinent negatives include no abdominal pain and no vomiting. Treatments tried: Dayquil, Nyquil, saline spray. The treatment provided no relief.    Past Medical History:  Diagnosis Date  . Asthma     There are no active problems to display for this patient.   Past Surgical History:  Procedure Laterality Date  . TOOTH EXTRACTION      OB History    No data available       Home Medications    Prior to Admission medications   Medication Sig Start Date End Date Taking? Authorizing Provider  amoxicillin-clavulanate (AUGMENTIN) 875-125 MG tablet Take 1 tablet by mouth every 12 (twelve) hours. 07/28/16   [name], PA-C  HYDROcodone-acetaminophen (HYCET) 7.5-325 mg/15 ml solution Take 15 mLs by mouth at bedtime as needed for moderate pain or severe pain (sore throat). 07/28/16   [name], PA-C  loratadine (CLARITIN) 10 MG tablet Take 1 tablet (10 mg total) by mouth daily. 07/28/16   [name], PA-C  naproxen (NAPROSYN) 375 MG tablet Take 1 tablet (375 mg total) by mouth 2 (two) times daily. 02/11/14   Blane Ohara, MD  penicillin v potassium (VEETID) 500 MG tablet Take 1 tablet (500 mg total) by mouth 4 (four) times daily. 02/20/14   Kaitlyn Szekalski, PA-C  traMADol (ULTRAM) 50 MG tablet Take 1 tablet (50 mg total) by mouth every 6 (six) hours as needed. 02/11/14   Blane Ohara, MD    Family History No  family history on file.  Social History Social History  Substance Use Topics  . Smoking status: Current Every Day Smoker  . Smokeless tobacco: Not on file  . Alcohol use Yes     Allergies   Patient has no known allergies.   Review of Systems Review of Systems  HENT: Positive for congestion, sinus pain and sore throat.   Gastrointestinal: Negative for abdominal pain and vomiting.  Ten systems reviewed and are negative for acute change, except as noted in the HPI.     Physical Exam Updated Vital Signs BP (!) 134/96 (BP Location: Right Arm)   Pulse 85   Temp 99 F (37.2 C) (Oral)   Resp 20   Wt 68.3 kg   SpO2 100%   BMI 27.54 kg/m   Physical Exam  Constitutional: She is oriented to person, place, and time. She appears well-developed and well-nourished. No distress.  HENT:  Head: Normocephalic and atraumatic.  Right Ear: External ear and ear canal normal. Tympanic membrane is erythematous. Tympanic membrane is not perforated. A middle ear effusion is present.  Left Ear: External ear and ear canal normal. Tympanic membrane is not perforated. A middle ear effusion is present.  Audible congestion. Bilateral nares patent. Uvula midline. Patient tolerating secretions without difficulty.  Eyes: Conjunctivae and EOM are normal. No scleral icterus.  Neck: Normal range of motion.  No nuchal rigidity or  meningismus  Cardiovascular: Normal rate and regular rhythm.   Pulmonary/Chest: Effort normal. No respiratory distress.  Respirations even and unlabored  Musculoskeletal: Normal range of motion.  Lymphadenopathy:  +anterior cervical lymphadenopathy  Neurological: She is alert and oriented to person, place, and time. She exhibits normal muscle tone. Coordination normal.  Skin: Skin is warm and dry. No rash noted. She is not diaphoretic. No erythema. No pallor.  Psychiatric: She has a normal mood and affect. Her behavior is normal.  Nursing note and vitals reviewed.    ED  Treatments / Results  Labs (all labs ordered are listed, but only abnormal results are displayed) Labs Reviewed  RAPID STREP SCREEN (NOT AT [facility])    EKG  EKG Interpretation None       Radiology No results found.  Procedures Procedures (including critical care time)  Medications Ordered in ED Medications  amoxicillin-clavulanate (AUGMENTIN) 875-125 MG per tablet 1 tablet (not administered)     Initial Impression / Assessment and Plan / ED Course  I have reviewed the triage vital signs and the nursing notes.  Pertinent labs & imaging results that were available during my care of the patient were reviewed by me and considered in my medical decision making (see chart for details).     Patient complaining of symptoms of sinusitis. Severe symptoms have been present for greater than 1 week with purulent nasal discharge and sinus pain as well as L sided otalgia. Concern for acute bacterial rhinosinusitis. Patient discharged with Augmentin. Instructions given for warm saline nasal wash and recommendations for follow-up with primary care physician.   Final Clinical Impressions(s) / ED Diagnoses   Final diagnoses:  Acute sinusitis, recurrence not specified, unspecified location    New Prescriptions New Prescriptions   AMOXICILLIN-CLAVULANATE (AUGMENTIN) 875-125 MG TABLET    Take 1 tablet by mouth every 12 (twelve) hours.   HYDROCODONE-ACETAMINOPHEN (HYCET) 7.5-325 MG/15 ML SOLUTION    Take 15 mLs by mouth at bedtime as needed for moderate pain or severe pain (sore throat).   LORATADINE (CLARITIN) 10 MG TABLET    Take 1 tablet (10 mg total) by mouth daily.     [name], PA-C 07/28/16 0149    Shon Baton, MD 07/30/16 [phone]

## 2016-07-28 NOTE — ED Triage Notes (Signed)
Pt here for URI symptoms, sore throat and bilateral otalgia for over 1 week. Pt denies fevers at home. States last medication today was nyquil tonight

## 2016-07-29 LAB — CULTURE, GROUP A STREP (THRC)

## 2017-06-17 ENCOUNTER — Emergency Department (HOSPITAL_COMMUNITY)
Admission: EM | Admit: 2017-06-17 | Discharge: 2017-06-17 | Disposition: A | Discharge status: Home / Self Care | Payer: Self-pay | Attending: Emergency Medicine | Admitting: Emergency Medicine

## 2017-06-17 ENCOUNTER — Encounter (HOSPITAL_COMMUNITY): Payer: Self-pay | Admitting: Emergency Medicine

## 2017-06-17 DIAGNOSIS — J45909 Unspecified asthma, uncomplicated: Secondary | ICD-10-CM | POA: Insufficient documentation

## 2017-06-17 DIAGNOSIS — N3090 Cystitis, unspecified without hematuria: Secondary | ICD-10-CM | POA: Insufficient documentation

## 2017-06-17 DIAGNOSIS — F17200 Nicotine dependence, unspecified, uncomplicated: Secondary | ICD-10-CM | POA: Insufficient documentation

## 2017-06-17 DIAGNOSIS — Z79899 Other long term (current) drug therapy: Secondary | ICD-10-CM | POA: Insufficient documentation

## 2017-06-17 LAB — I-STAT BETA HCG BLOOD, ED (MC, WL, AP ONLY): I-stat hCG, quantitative: <5 m[IU]/mL (ref <5)

## 2017-06-17 LAB — URINALYSIS, ROUTINE W REFLEX MICROSCOPIC
Bilirubin Urine: NEGATIVE
GLUCOSE, UA: NEGATIVE mg/dL
Hgb urine dipstick: NEGATIVE
KETONES UR: NEGATIVE mg/dL
Nitrite: POSITIVE — AB
PH: 6 (ref 5.0–8.0)
Protein, ur: NEGATIVE mg/dL
SPECIFIC GRAVITY, URINE: 1.016 (ref 1.005–1.030)

## 2017-06-17 MED ORDER — CEPHALEXIN 500 MG PO CAPS: 500.0000 mg | ORAL_CAPSULE | Freq: Two times a day (BID) | ORAL | 0 refills | Status: AC

## 2017-06-17 NOTE — Discharge Instructions (Signed)
You have a urinary tract infection.  Please take antibiotic twice a day for the next week.  Please take all of your antibiotics until finished!   You may develop abdominal discomfort or diarrhea from the antibiotic.  You may help offset this with probiotics which you can buy or get in yogurt. Do not eat  or take the probiotics until 2 hours after your antibiotic.   You may continue taking Azo's for pain and cranberry juice.  Return to the emergency department if you develop fever greater than 100.4 F, vomiting that does not stop or have any new or worsening symptoms.

## 2017-06-17 NOTE — ED Provider Notes (Signed)
[facility] EMERGENCY DEPARTMENT Provider Note   CSN: [id number] Arrival date & time: 06/17/17  1557     History   Chief Complaint Chief Complaint  Patient presents with  . Urinary Frequency    HPI Ann Mcmillan is a 32 y.o. female.  HPI  Ann Mcmillan is a 32yo female with a history of asthma who presents to the emergency department for evaluation of urinary frequency, urgency and dysuria.  Patient states that her symptoms began about three days ago.  States that she has had a UTI in the past and this feels similar.  Has been taking over-the-counter Azo and drinking a lot of water without much improvement.  She also states that she had a scant amount of vaginal bleeding three days ago after taking Plan B when a condom broke while having sexual intercourse.  Has not had bleeding since. States that she also noticed some mild vaginal discharge a few days ago, declines STD testing.  She denies fevers, chills, abdominal pain, hematuria, diarrhea, nausea/vomiting, chest pain, shortness of breath.   Past Medical History:  Diagnosis Date  . Asthma     There are no active problems to display for this patient.   Past Surgical History:  Procedure Laterality Date  . TOOTH EXTRACTION      OB History    No data available       Home Medications    Prior to Admission medications   Medication Sig Start Date End Date Taking? Authorizing Provider  amoxicillin-clavulanate (AUGMENTIN) 875-125 MG tablet Take 1 tablet by mouth every 12 (twelve) hours. 07/28/16   [name], PA-C  HYDROcodone-acetaminophen (HYCET) 7.5-325 mg/15 ml solution Take 15 mLs by mouth at bedtime as needed for moderate pain or severe pain (sore throat). 07/28/16   [name], PA-C  loratadine (CLARITIN) 10 MG tablet Take 1 tablet (10 mg total) by mouth Mcmillan. 07/28/16   [name], PA-C  naproxen (NAPROSYN) 375 MG tablet Take 1 tablet (375 mg total) by mouth 2 (two) times Mcmillan. 02/11/14   Blane Ohara, MD  penicillin v potassium (VEETID) 500 MG tablet Take 1 tablet (500 mg total) by mouth 4 (four) times Mcmillan. 02/20/14   Szekalski, Kaitlyn, PA-C  traMADol (ULTRAM) 50 MG tablet Take 1 tablet (50 mg total) by mouth every 6 (six) hours as needed. 02/11/14   Blane Ohara, MD    Family History No family history on file.  Social History Social History   Tobacco Use  . Smoking status: Current Every Day Smoker  . Smokeless tobacco: Never Used  Substance Use Topics  . Alcohol use: Yes  . Drug use: Yes    Types: Marijuana     Allergies   Patient has no known allergies.   Review of Systems Review of Systems  Constitutional: Negative for chills and fever.  HENT: Negative for congestion.   Respiratory: Negative for shortness of breath.   Cardiovascular: Negative for chest pain.  Gastrointestinal: Negative for abdominal pain, nausea and vomiting.  Genitourinary: Positive for decreased urine volume, dysuria, frequency, urgency and vaginal discharge. Negative for difficulty urinating, flank pain, genital sores, hematuria and pelvic pain.  Musculoskeletal: Negative for back pain and gait problem.  Skin: Negative for rash.  Neurological: Negative for headaches.  Psychiatric/Behavioral: Negative for agitation.     Physical Exam Updated Vital Signs BP (!) 144/89 (BP Location: Right Arm)   Pulse 87   Temp 98.8 F (37.1 C) (Oral)   Resp 18  SpO2 100%   Physical Exam  Constitutional: She is oriented to person, place, and time. She appears well-developed and well-nourished. No distress.  HENT:  Head: Normocephalic and atraumatic.  Mouth/Throat: Oropharynx is clear and moist. No oropharyngeal exudate.  Eyes: Conjunctivae are normal. Pupils are equal, round, and reactive to light. Right eye exhibits no discharge. Left eye exhibits no discharge.  Neck: Normal range of motion. Neck supple.  Pulmonary/Chest: Effort normal. No respiratory distress.  Abdominal: Soft. Bowel  sounds are normal. There is no tenderness. There is no guarding.  No CVA tenderness.  Musculoskeletal: Normal range of motion.  Neurological: She is alert and oriented to person, place, and time. Coordination normal.  Skin: Skin is warm and dry. She is not diaphoretic.  Psychiatric: She has a normal mood and affect. Her behavior is normal.  Nursing note and vitals reviewed.    ED Treatments / Results  Labs (all labs ordered are listed, but only abnormal results are displayed) Labs Reviewed  URINALYSIS, ROUTINE W REFLEX MICROSCOPIC - Abnormal; Notable for the following components:      Result Value   Color, Urine AMBER (*)    APPearance HAZY (*)    Nitrite POSITIVE (*)    Leukocytes, UA LARGE (*)    Bacteria, UA MANY (*)    Squamous Epithelial / LPF 0-5 (*)    Non Squamous Epithelial 0-5 (*)    All other components within normal limits  I-STAT BETA HCG BLOOD, ED ([facility], [facility], [facility] ONLY)    EKG  EKG Interpretation None       Radiology No results found.  Procedures Procedures (including critical care time)  Medications Ordered in ED Medications - No data to display   Initial Impression / Assessment and Plan / ED Course  I have reviewed the triage vital signs and the nursing notes.  Pertinent labs & imaging results that were available during my care of the patient were reviewed by me and considered in my medical decision making (see chart for details).    Pt has been diagnosed with a UTI. Pt is afebrile, no CVA tenderness, normotensive, and denies N/V. Pt to be dc home with antibiotics and instructions to follow up with PCP if symptoms persist.   Final Clinical Impressions(s) / ED Diagnoses   Final diagnoses:  Cystitis    ED Discharge Orders        Ordered    cephALEXin (KEFLEX) 500 MG capsule  2 times Mcmillan     06/17/17 1838       Lawrence MarseillesShrosbree, Kynlee Koenigsberg J, PA-C 06/17/17 1840    Tilden Fossaees, Elizabeth, MD 06/18/17 [phone]

## 2017-06-17 NOTE — ED Triage Notes (Signed)
Pt c/o urinary frequency x 4 days. Denies discharge, pt reports some bleeding, but recently took Plan B.

## 2017-07-03 ENCOUNTER — Emergency Department (HOSPITAL_COMMUNITY)
Admission: EM | Admit: 2017-07-03 | Discharge: 2017-07-03 | Disposition: A | Discharge status: Home / Self Care | Payer: Self-pay | Attending: Emergency Medicine | Admitting: Emergency Medicine

## 2017-07-03 ENCOUNTER — Encounter (HOSPITAL_COMMUNITY): Payer: Self-pay

## 2017-07-03 DIAGNOSIS — Z79899 Other long term (current) drug therapy: Secondary | ICD-10-CM | POA: Insufficient documentation

## 2017-07-03 DIAGNOSIS — R300 Dysuria: Secondary | ICD-10-CM | POA: Insufficient documentation

## 2017-07-03 DIAGNOSIS — F17210 Nicotine dependence, cigarettes, uncomplicated: Secondary | ICD-10-CM | POA: Insufficient documentation

## 2017-07-03 DIAGNOSIS — J45909 Unspecified asthma, uncomplicated: Secondary | ICD-10-CM | POA: Insufficient documentation

## 2017-07-03 DIAGNOSIS — R030 Elevated blood-pressure reading, without diagnosis of hypertension: Secondary | ICD-10-CM | POA: Insufficient documentation

## 2017-07-03 LAB — URINALYSIS, ROUTINE W REFLEX MICROSCOPIC
BILIRUBIN URINE: NEGATIVE
GLUCOSE, UA: NEGATIVE mg/dL
Ketones, ur: NEGATIVE mg/dL
NITRITE: NEGATIVE
PH: 6 (ref 5.0–8.0)
Protein, ur: NEGATIVE mg/dL
SPECIFIC GRAVITY, URINE: 1.006 (ref 1.005–1.030)

## 2017-07-03 LAB — WET PREP, GENITAL
Sperm: NONE SEEN
Yeast Wet Prep HPF POC: NONE SEEN

## 2017-07-03 LAB — POC URINE PREG, ED: Preg Test, Ur: NEGATIVE

## 2017-07-03 MED ORDER — METRONIDAZOLE 500 MG PO TABS: 500.0000 mg | ORAL_TABLET | Freq: Two times a day (BID) | ORAL | 0 refills | Status: DC

## 2017-07-03 MED ORDER — CEFTRIAXONE SODIUM 250 MG IJ SOLR
250.0000 mg | Freq: Once | INTRAMUSCULAR | Status: AC
  Administered 2017-07-03: 250 mg via INTRAMUSCULAR
  Filled 2017-07-03: qty 250

## 2017-07-03 MED ORDER — METRONIDAZOLE 500 MG PO TABS
500.0000 mg | ORAL_TABLET | Freq: Once | ORAL | Status: AC
  Administered 2017-07-03: 500 mg via ORAL
  Filled 2017-07-03: qty 1

## 2017-07-03 MED ORDER — AZITHROMYCIN 250 MG PO TABS
1000.0000 mg | ORAL_TABLET | Freq: Once | ORAL | Status: AC
Start: 2017-07-03 — End: 2017-07-03
  Administered 2017-07-03: 1000 mg via ORAL
  Filled 2017-07-03: qty 4

## 2017-07-03 MED ORDER — ACYCLOVIR 400 MG PO TABS: 400.0000 mg | ORAL_TABLET | Freq: Three times a day (TID) | ORAL | 0 refills | Status: AC

## 2017-07-03 NOTE — Discharge Instructions (Signed)
Medications: You are treated with Flagyl today.  You will take this for 7 days.  Please do not drink alcohol while taking this, and refrain from alcohol for 2 days afterwards.  The Flagyl treats you were also treated for gonorrhea and chlamydia today.    You were also treated with valacyclovir.  You will take this to clear a herpes outbreak.  As long as you have itching and burning of the lesions on your vagina, you should not be having sexual intercourse.  If this is a herpes lesion, it will be transmitted until it is completely cleared over.  Use a condom with every sexual encounter Follow up with your doctor OBGYN in regards to today's visit.    Please return to the ER for worsening symptoms, high fevers or persistent vomiting.  You have been tested for HIV, syphilis, chlamydia and gonorrhea. These results will be available in approximately 3 days. You will be notified if they are positive.   It is very important to practice safe sex and use condoms when sexually active. If your results are positive you need to notify all sexual partners so they can be treated as well. The website https://garcia.net/http://www.dontspreadit.com/ can be used to send anonymous text messages or emails to alert sexual contacts.   SEEK IMMEDIATE MEDICAL CARE IF:  You develop an oral temperature above 102 F (38.9 C), not controlled by medications or lasting more than 2 days.  You develop an increase in pain.  You develop vaginal bleeding and it is not time for your period.  You develop painful intercourse.    For your dizziness, you may eventually need to see a primary care provider to receive a cardiac workup such as wearing a Holter monitor, getting an EKG, or getting further blood work.  In the meantime.  I recommend that she do this as soon as possible.

## 2017-07-03 NOTE — ED Notes (Signed)
PA bedside for pelvic exam accompanied by female EMT

## 2017-07-03 NOTE — ED Notes (Signed)
Patient ambulatory to bathroom with steady gait at this time 

## 2017-07-03 NOTE — ED Provider Notes (Signed)
[facility] EMERGENCY DEPARTMENT Provider Note   CSN: [id number] Arrival date & time: 07/03/17  1453     History   Chief Complaint Chief Complaint  Patient presents with  . dysuria/dizziness    HPI Ann Mcmillan is a 32 y.o. female.  HPI   Dysuria  Patient reports that she was treated a couple weeks ago for urinary tract infection, and had a couple day resolution of her feeling of urgency, but then noted that she began to have some burning at the end of her urination.  Patient reports that she purchased Monistat, which helped her symptoms but then noted that she still had a burning around her labia.  Patient then scrubbed to the labia and believes she may have irritated it.  Patient denies any copious vaginal discharge at this time, vaginal bleeding, urgency or frequency, suprapubic pain, belly pain, flank pain, nausea, or vomiting.  Patient denies any fevers.  Patient reports she is sexually active with one female partner, monogamous, for 3 months.  Patient does report that she has concerns that there could have been STI exposure from her partner.  Dizziness  I discussed her dizziness, patient reported that she has had episodes of lightheadedness at random with syncope since the eighth grade.  She wanted to discuss this while she was here.  Patient reports that she will occasionally in a sensation like her vision is closing in at random times, and sometimes has palpitations associated with this.  Patient reports it is been a few years since she had syncope.  Patient reports that she has never bit her tongue or urinated on herself, and is able to wake up fairly rapidly from these episodes.  Blank stares or tonic-clonic movements have never been observed by bystanders and these incidents.  Past Medical History:  Diagnosis Date  . Asthma     There are no active problems to display for this patient.   Past Surgical History:  Procedure Laterality Date  . TOOTH  EXTRACTION      OB History    No data available       Home Medications    Prior to Admission medications   Medication Sig Start Date End Date Taking? Authorizing Provider  amoxicillin-clavulanate (AUGMENTIN) 875-125 MG tablet Take 1 tablet by mouth every 12 (twelve) hours. 07/28/16   [name], PA-C  HYDROcodone-acetaminophen (HYCET) 7.5-325 mg/15 ml solution Take 15 mLs by mouth at bedtime as needed for moderate pain or severe pain (sore throat). 07/28/16   [name], PA-C  loratadine (CLARITIN) 10 MG tablet Take 1 tablet (10 mg total) by mouth daily. 07/28/16   [name], PA-C  naproxen (NAPROSYN) 375 MG tablet Take 1 tablet (375 mg total) by mouth 2 (two) times daily. 02/11/14   Blane Ohara, MD  penicillin v potassium (VEETID) 500 MG tablet Take 1 tablet (500 mg total) by mouth 4 (four) times daily. 02/20/14   Szekalski, Kaitlyn, PA-C  traMADol (ULTRAM) 50 MG tablet Take 1 tablet (50 mg total) by mouth every 6 (six) hours as needed. 02/11/14   Blane Ohara, MD    Family History No family history on file.  Social History Social History   Tobacco Use  . Smoking status: Current Every Day Smoker  . Smokeless tobacco: Never Used  Substance Use Topics  . Alcohol use: Yes  . Drug use: Yes    Types: Marijuana     Allergies   Patient has no known allergies.   Review of  Systems Review of Systems  Constitutional: Negative for chills and fever.  HENT: Negative for rhinorrhea and sore throat.   Respiratory: Negative for chest tightness and shortness of breath.   Cardiovascular: Negative for chest pain and palpitations.  Gastrointestinal: Negative for abdominal pain, nausea and vomiting.  Genitourinary: Positive for dysuria and vaginal pain. Negative for flank pain, pelvic pain, vaginal bleeding and vaginal discharge.  Skin: Positive for rash.  Neurological: Positive for dizziness and syncope.  All other systems reviewed and are negative.    Physical  Exam Updated Vital Signs BP (!) 149/118 (BP Location: Right Arm)   Pulse 92   Temp 98.6 F (37 C) (Oral)   Resp 20   SpO2 100%   Physical Exam  Constitutional: She appears well-developed and well-nourished. No distress.  HENT:  Head: Normocephalic and atraumatic.  Mouth/Throat: Oropharynx is clear and moist.  Eyes: Conjunctivae and EOM are normal. Pupils are equal, round, and reactive to light.  Neck: Normal range of motion. Neck supple.  Cardiovascular: Normal rate, regular rhythm, S1 normal and S2 normal.  No murmur heard. Pulmonary/Chest: Effort normal and breath sounds normal. She has no wheezes. She has no rales.  Abdominal: Soft. She exhibits no distension. There is no tenderness. There is no guarding.  Genitourinary:  Genitourinary Comments: Exam performed with EMT chaperone present.  No inguinal lymphadenopathy.  No perineal lesions.  Cluster of ulcerations with small amount of bleeding on left labia majora.  No lesions identified in the vaginal canal.  Cervical os with small amount of milky white discharge present.  Bimanual exam without cervical motion tenderness, suprapubic tenderness, or adnexal tenderness.   Musculoskeletal: Normal range of motion. She exhibits no edema or deformity.  Lymphadenopathy:    She has no cervical adenopathy.  Neurological: She is alert.  Cranial nerves grossly intact. Patient moves extremities symmetrically and with good coordination.  Skin: Skin is warm and dry. No rash noted. No erythema.  Psychiatric: She has a normal mood and affect. Her behavior is normal. Judgment and thought content normal.  Nursing note and vitals reviewed.    ED Treatments / Results  Labs (all labs ordered are listed, but only abnormal results are displayed) Labs Reviewed  URINALYSIS, ROUTINE W REFLEX MICROSCOPIC - Abnormal; Notable for the following components:      Result Value   Color, Urine STRAW (*)    APPearance HAZY (*)    Hgb urine dipstick MODERATE  (*)    Leukocytes, UA LARGE (*)    Bacteria, UA FEW (*)    Squamous Epithelial / LPF 0-5 (*)    All other components within normal limits  URINE CULTURE  WET PREP, GENITAL  RPR  HIV ANTIBODY (ROUTINE TESTING)  POC URINE PREG, ED  GC/CHLAMYDIA PROBE AMP ([place]) NOT AT [facility]    EKG  EKG Interpretation None       Radiology No results found.  Procedures Procedures (including critical care time)  Medications Ordered in ED Medications  cefTRIAXone (ROCEPHIN) injection 250 mg (not administered)  azithromycin (ZITHROMAX) tablet 1,000 mg (not administered)     Initial Impression / Assessment and Plan / ED Course  I have reviewed the triage vital signs and the nursing notes.  Pertinent labs & imaging results that were available during my care of the patient were reviewed by me and considered in my medical decision making (see chart for details).  Clinical Course as of Jul 04 313  Sat Jul 03, 2017  2003 On discussion  with the patient after her pelvic exam, patient denied any desire to workup her dizziness.  I told her I recommended EKG and basic blood work, followed by follow-up with PCP, but patient reports that she did not want to pursue this workup at this time, and only wanted her STI workup.  [AM]    Clinical Course User Index [AM] Elisha PonderMurray, Harvest Stanco B, PA-C   Patient presenting with concern over vaginal lesions as well as dizziness times many years. Patient declined further workup with her dizziness after discussion of her vaginal complaints.  Patient with likely herpetic lesions on labia, and herpetic lesions consistent with history.  Additionally, patient was Trichomonas and bacterial vaginosis.  Due to multiple STI's, and history of vaginal discharge that has improved, will treat patient empirically for gonorrhea and chlamydia.  Urine sent for culture.  Do not feel it is consistent with infection given patient's other symptoms, and patient recently completed a course of  antibiotics for UTI.  Return precautions for any fevers, chills, flank pain, abdominal pain, suprapubic pain.  Patient discharged with treatments for Flagyl and acyclovir.  Patient educated on partner notification and safe sex practices.  Regarding patient's dizziness, patient declined further workup, but provided recommendation for PCP follow-up for further workup for cardiogenic or other causes of syncope.  Patient is in understanding and agrees with plan of care.  Final Clinical Impressions(s) / ED Diagnoses   Final diagnoses:  Elevated blood pressure reading without diagnosis of hypertension  Dysuria    ED Discharge Orders        Ordered    metroNIDAZOLE (FLAGYL) 500 MG tablet  2 times daily     07/03/17 2213    acyclovir (ZOVIRAX) 400 MG tablet  3 times daily     07/03/17 2213       Elisha PonderMurray, Shaleta Ruacho B, [place]PA-C 07/04/17 [id number]    Tegeler, Canary Brimhristopher J, MD 07/04/17 1045

## 2017-07-03 NOTE — ED Triage Notes (Signed)
Patient complains of recurrent dysuria and pain to labia when she wipes following urination. States just finished antibiotic 1 week ago. Also complains of years of dizziness and recurrent abscess to left axilla, NAD. No neuro deficits, unable to provide urine in triage

## 2017-07-04 LAB — HIV ANTIBODY (ROUTINE TESTING W REFLEX): HIV SCREEN 4TH GENERATION: NONREACTIVE

## 2017-07-04 LAB — RPR: RPR: NONREACTIVE

## 2017-07-05 LAB — HSV 1 ANTIBODY, IGG

## 2017-07-05 LAB — GC/CHLAMYDIA PROBE AMP (~~LOC~~) NOT AT ARMC
Chlamydia: NEGATIVE
NEISSERIA GONORRHEA: NEGATIVE

## 2017-07-05 LAB — HSV 2 ANTIBODY, IGG: HSV 2 Glycoprotein G Ab, IgG: 13.8 index — ABNORMAL HIGH (ref 0.00–0.90)

## 2017-07-06 LAB — URINE CULTURE: Culture: 30000 — AB

## 2017-07-07 NOTE — Progress Notes (Signed)
ED Antimicrobial Stewardship Positive Culture Follow Up   Ann Mcmillan is an 32 y.o. female who [facility] 07[facility]/2017 with a chief complaint of  Chief Complaint  Patient presents with  . dysuria/dizziness    Recent Results (from the past 720 hour(s))  Wet prep, genital     Status: Abnormal   Collection Time: 07/03/17  7:34 PM  Result Value Ref Range Status   Yeast Wet Prep HPF POC NONE SEEN NONE SEEN Final   Trich, Wet Prep PRESENT (A) NONE SEEN Final   Clue Cells Wet Prep HPF POC PRESENT (A) NONE SEEN Final   WBC, Wet Prep HPF POC MANY (A) NONE SEEN Final   Sperm NONE SEEN  Final    Comment: Performed at [facility], [address] [place], [place] [address]  Urine culture     Status: Abnormal   Collection Time: 07/03/17  9:44 PM  Result Value Ref Range Status   Specimen Description URINE, RANDOM  Final   Special Requests   Final    NONE Performed at [facility], [address] [place], [place] [address]    Culture 30,000 COLONIES/mL ESCHERICHIA COLI (A)  Final   Report Status 07/06/2017 FINAL  Final   Organism ID, Bacteria ESCHERICHIA COLI (A)  Final      Susceptibility   Escherichia coli - MIC*    AMPICILLIN <=2 SENSITIVE Sensitive     CEFAZOLIN <=4 SENSITIVE Sensitive     CEFTRIAXONE <=1 SENSITIVE Sensitive     CIPROFLOXACIN <=0.25 SENSITIVE Sensitive     GENTAMICIN <=1 SENSITIVE Sensitive     IMIPENEM <=0.25 SENSITIVE Sensitive     NITROFURANTOIN <=16 SENSITIVE Sensitive     TRIMETH/SULFA <=20 SENSITIVE Sensitive     AMPICILLIN/SULBACTAM <=2 SENSITIVE Sensitive     PIP/TAZO <=4 SENSITIVE Sensitive     Extended ESBL NEGATIVE Sensitive     * 30,000 COLONIES/mL ESCHERICHIA COLI    Treated for multiple STIs.  Per PA note not consistent with UTI. Low colony count. No treatment required.  Sallee Provencalurner, Derin Granquist S 07/07/2017, 8:45 AM Infectious Diseases Pharmacist Phone# [phone]

## 2019-05-03 ENCOUNTER — Other Ambulatory Visit: Payer: Self-pay

## 2019-05-03 ENCOUNTER — Encounter (HOSPITAL_COMMUNITY): Payer: Self-pay | Admitting: Emergency Medicine

## 2019-05-03 ENCOUNTER — Emergency Department (HOSPITAL_COMMUNITY)
Admission: EM | Admit: 2019-05-03 | Discharge: 2019-05-03 | Disposition: A | Discharge status: Home / Self Care | Payer: Self-pay | Attending: Emergency Medicine | Admitting: Emergency Medicine

## 2019-05-03 DIAGNOSIS — K029 Dental caries, unspecified: Secondary | ICD-10-CM | POA: Insufficient documentation

## 2019-05-03 DIAGNOSIS — F17210 Nicotine dependence, cigarettes, uncomplicated: Secondary | ICD-10-CM | POA: Insufficient documentation

## 2019-05-03 MED ORDER — DICLOFENAC SODIUM 75 MG PO TBEC: 75.0000 mg | DELAYED_RELEASE_TABLET | Freq: Two times a day (BID) | ORAL | 0 refills | Status: DC

## 2019-05-03 MED ORDER — OXYCODONE-ACETAMINOPHEN 5-325 MG PO TABS
1.0000 | ORAL_TABLET | ORAL | Status: DC | PRN
  Administered 2019-05-03: 08:00:00 1 via ORAL
  Filled 2019-05-03: qty 1

## 2019-05-03 MED ORDER — AMOXICILLIN 500 MG PO CAPS: 500.0000 mg | ORAL_CAPSULE | Freq: Three times a day (TID) | ORAL | 0 refills | Status: DC

## 2019-05-03 NOTE — Discharge Instructions (Signed)
Return if any problems.

## 2019-05-03 NOTE — ED Provider Notes (Signed)
[facility] EMERGENCY DEPARTMENT Provider Note   CSN: [id number] Arrival date & time: 05/03/19  0756     History Chief Complaint  Patient presents with  . Dental Pain    Ann Mcmillan is a 34 y.o. female.  The history is provided by the patient. No language interpreter was used.  Dental Pain Location:  Upper Upper teeth location:  4/RU 2nd bicuspid Quality:  Aching Severity:  Mild Onset quality:  Gradual Timing:  Constant Progression:  Worsening Chronicity:  New Context: poor dentition   Relieved by:  Nothing Worsened by:  Nothing Ineffective treatments:  None tried Associated symptoms: no fever        Past Medical History:  Diagnosis Date  . Asthma     There are no problems to display for this patient.   Past Surgical History:  Procedure Laterality Date  . TOOTH EXTRACTION       OB History   No obstetric history on file.     No family history on file.  Social History   Tobacco Use  . Smoking status: Current Every Day Smoker  . Smokeless tobacco: Never Used  Substance Use Topics  . Alcohol use: Yes  . Drug use: Yes    Types: Marijuana    Home Medications Prior to Admission medications   Medication Sig Start Date End Date Taking? Authorizing Provider  amoxicillin (AMOXIL) 500 MG capsule Take 1 capsule (500 mg total) by mouth 3 (three) times daily. 05/03/19   Elson Areas, PA-C  Aspirin-Salicylamide-Caffeine (BC HEADACHE POWDER PO) Take 1 packet by mouth every 6 (six) hours as needed (for pain or toothaches). For pain    [provider]  diclofenac (VOLTAREN) 75 MG EC tablet Take 1 tablet (75 mg total) by mouth 2 (two) times daily. 05/03/19   Elson Areas, PA-C  ibuprofen (ADVIL,MOTRIN) 200 MG tablet Take 800 mg by mouth every 6 (six) hours as needed (for pain or toothaches).    [provider]  Ibuprofen-diphenhydrAMINE Cit (MOTRIN PM) 200-38 MG TABS Take 1-2 tablets by mouth at bedtime as needed (for  sleep).    [provider]  loratadine (CLARITIN) 10 MG tablet Take 1 tablet (10 mg total) by mouth daily. Patient not taking: Reported on 07/03/2017 07/28/16 05/03/19  [name], PA-C    Allergies    Patient has no known allergies.  Review of Systems   Review of Systems  Constitutional: Negative for fever.  All other systems reviewed and are negative.   Physical Exam Updated Vital Signs BP (!) 131/108 (BP Location: Right Arm)   Pulse (!) 104   Temp 98.3 F (36.8 C) (Oral)   Resp (!) 22   LMP 05/01/2019   SpO2 100%   Physical Exam Vitals and nursing note reviewed.  Constitutional:      Appearance: She is well-developed.  HENT:     Head: Normocephalic.     Mouth/Throat:     Comments: Slight erythema gums, no abscess Cardiovascular:     Rate and Rhythm: Normal rate.  Pulmonary:     Effort: Pulmonary effort is normal.  Abdominal:     General: There is no distension.  Musculoskeletal:        General: Normal range of motion.     Cervical back: Normal range of motion.  Skin:    General: Skin is warm.  Neurological:     General: No focal deficit present.     Mental Status: She is alert and  oriented to person, place, and time.  Psychiatric:        Mood and Affect: Mood normal.     ED Results / Procedures / Treatments   Labs (all labs ordered are listed, but only abnormal results are displayed) Labs Reviewed - No data to display  EKG None  Radiology No results found.  Procedures Procedures (including critical care time)  Medications Ordered in ED Medications  oxyCODONE-acetaminophen (PERCOCET/ROXICET) 5-325 MG per tablet 1 tablet (1 tablet Oral Given 05/03/19 0805)    ED Course  I have reviewed the triage vital signs and the nursing notes.  Pertinent labs & imaging results that were available during my care of the patient were reviewed by me and considered in my medical decision making (see chart for details).    MDM Rules/Calculators/A&P                        Final Clinical Impression(s) / ED Diagnoses Final diagnoses:  Dental caries    Rx / DC Orders ED Discharge Orders         Ordered    amoxicillin (AMOXIL) 500 MG capsule  3 times daily     05/03/19 0908    diclofenac (VOLTAREN) 75 MG EC tablet  2 times daily     05/03/19 0908        An After Visit Summary was printed and given to the patient.    Fransico Meadow, [place] 05/03/19 6979    Isla Pence, MD 05/03/19 1020

## 2019-05-03 NOTE — ED Triage Notes (Signed)
Pt reports R upper dental pain x4 days with pain radiating into R ear and R neck, has been taking tylenol and BC powder without relief. Has appt with her dentist to have tooth pulled but she states it is not for 2 months. Pt tearful in triage.

## 2020-01-03 ENCOUNTER — Emergency Department (HOSPITAL_COMMUNITY): Payer: Medicaid Other

## 2020-01-03 ENCOUNTER — Inpatient Hospital Stay (HOSPITAL_COMMUNITY)
Admission: EM | Admit: 2020-01-03 | Discharge: 2020-01-10 | DRG: 177 | Disposition: A | Discharge status: Home / Self Care | Payer: Medicaid Other | Attending: Family Medicine | Admitting: Family Medicine

## 2020-01-03 ENCOUNTER — Encounter (HOSPITAL_COMMUNITY): Payer: Self-pay

## 2020-01-03 DIAGNOSIS — F17290 Nicotine dependence, other tobacco product, uncomplicated: Secondary | ICD-10-CM | POA: Diagnosis present

## 2020-01-03 DIAGNOSIS — I10 Essential (primary) hypertension: Secondary | ICD-10-CM | POA: Diagnosis present

## 2020-01-03 DIAGNOSIS — Z791 Long term (current) use of non-steroidal anti-inflammatories (NSAID): Secondary | ICD-10-CM

## 2020-01-03 DIAGNOSIS — R079 Chest pain, unspecified: Secondary | ICD-10-CM

## 2020-01-03 DIAGNOSIS — E876 Hypokalemia: Secondary | ICD-10-CM | POA: Diagnosis present

## 2020-01-03 DIAGNOSIS — J9601 Acute respiratory failure with hypoxia: Secondary | ICD-10-CM | POA: Diagnosis present

## 2020-01-03 DIAGNOSIS — J45909 Unspecified asthma, uncomplicated: Secondary | ICD-10-CM | POA: Diagnosis present

## 2020-01-03 DIAGNOSIS — F17200 Nicotine dependence, unspecified, uncomplicated: Secondary | ICD-10-CM | POA: Diagnosis present

## 2020-01-03 DIAGNOSIS — I2694 Multiple subsegmental pulmonary emboli without acute cor pulmonale: Secondary | ICD-10-CM

## 2020-01-03 DIAGNOSIS — Z79899 Other long term (current) drug therapy: Secondary | ICD-10-CM

## 2020-01-03 DIAGNOSIS — R0602 Shortness of breath: Secondary | ICD-10-CM

## 2020-01-03 DIAGNOSIS — J1282 Pneumonia due to coronavirus disease 2019: Secondary | ICD-10-CM | POA: Diagnosis present

## 2020-01-03 DIAGNOSIS — F1010 Alcohol abuse, uncomplicated: Secondary | ICD-10-CM | POA: Diagnosis present

## 2020-01-03 DIAGNOSIS — I2699 Other pulmonary embolism without acute cor pulmonale: Secondary | ICD-10-CM | POA: Diagnosis present

## 2020-01-03 DIAGNOSIS — U071 COVID-19: Principal | ICD-10-CM | POA: Diagnosis present

## 2020-01-03 HISTORY — DX: COVID-19: U07.1

## 2020-01-03 LAB — BASIC METABOLIC PANEL
Anion gap: 12 (ref 5–15)
BUN: 6 mg/dL (ref 6–20)
CO2: 24 mmol/L (ref 22–32)
Calcium: 8.7 mg/dL — ABNORMAL LOW (ref 8.9–10.3)
Chloride: 98 mmol/L (ref 98–111)
Creatinine, Ser: 0.8 mg/dL (ref 0.44–1.00)
GFR calc Af Amer: >60 mL/min (ref >60)
GFR calc non Af Amer: >60 mL/min (ref >60)
Glucose, Bld: 128 mg/dL — ABNORMAL HIGH (ref 70–99)
Potassium: 2.9 mmol/L — ABNORMAL LOW (ref 3.5–5.1)
Sodium: 134 mmol/L — ABNORMAL LOW (ref 135–145)

## 2020-01-03 LAB — CBC
HCT: 41 % (ref 36.0–46.0)
Hemoglobin: 13.9 g/dL (ref 12.0–15.0)
MCH: 31.1 pg (ref 26.0–34.0)
MCHC: 33.9 g/dL (ref 30.0–36.0)
MCV: 91.7 fL (ref 80.0–100.0)
Platelets: 317 10*3/uL (ref 150–400)
RBC: 4.47 MIL/uL (ref 3.87–5.11)
RDW: 11.9 % (ref 11.5–15.5)
WBC: 7.2 10*3/uL (ref 4.0–10.5)
nRBC: 0 % (ref 0.0–0.2)

## 2020-01-03 LAB — TROPONIN I (HIGH SENSITIVITY): Troponin I (High Sensitivity): 5 ng/L (ref <18)

## 2020-01-03 LAB — I-STAT BETA HCG BLOOD, ED (MC, WL, AP ONLY): I-stat hCG, quantitative: <5 m[IU]/mL (ref <5)

## 2020-01-03 NOTE — ED Triage Notes (Signed)
Pt reports that she has been covid + since sat and having CP and SOB since Sun

## 2020-01-04 ENCOUNTER — Other Ambulatory Visit: Payer: Self-pay

## 2020-01-04 ENCOUNTER — Emergency Department (HOSPITAL_COMMUNITY): Payer: Medicaid Other

## 2020-01-04 ENCOUNTER — Encounter (HOSPITAL_COMMUNITY): Payer: Self-pay | Admitting: Internal Medicine

## 2020-01-04 DIAGNOSIS — J45909 Unspecified asthma, uncomplicated: Secondary | ICD-10-CM | POA: Diagnosis present

## 2020-01-04 DIAGNOSIS — I10 Essential (primary) hypertension: Secondary | ICD-10-CM | POA: Diagnosis present

## 2020-01-04 DIAGNOSIS — M7989 Other specified soft tissue disorders: Secondary | ICD-10-CM | POA: Diagnosis not present

## 2020-01-04 DIAGNOSIS — E876 Hypokalemia: Secondary | ICD-10-CM | POA: Diagnosis present

## 2020-01-04 DIAGNOSIS — F17290 Nicotine dependence, other tobacco product, uncomplicated: Secondary | ICD-10-CM | POA: Diagnosis present

## 2020-01-04 DIAGNOSIS — U071 COVID-19: Secondary | ICD-10-CM | POA: Diagnosis present

## 2020-01-04 DIAGNOSIS — R0602 Shortness of breath: Secondary | ICD-10-CM | POA: Diagnosis not present

## 2020-01-04 DIAGNOSIS — Z791 Long term (current) use of non-steroidal anti-inflammatories (NSAID): Secondary | ICD-10-CM | POA: Diagnosis not present

## 2020-01-04 DIAGNOSIS — Z79899 Other long term (current) drug therapy: Secondary | ICD-10-CM | POA: Diagnosis not present

## 2020-01-04 DIAGNOSIS — J1282 Pneumonia due to coronavirus disease 2019: Secondary | ICD-10-CM | POA: Diagnosis present

## 2020-01-04 DIAGNOSIS — J9601 Acute respiratory failure with hypoxia: Secondary | ICD-10-CM | POA: Diagnosis present

## 2020-01-04 DIAGNOSIS — F17200 Nicotine dependence, unspecified, uncomplicated: Secondary | ICD-10-CM | POA: Diagnosis present

## 2020-01-04 DIAGNOSIS — R079 Chest pain, unspecified: Secondary | ICD-10-CM | POA: Diagnosis not present

## 2020-01-04 DIAGNOSIS — I2699 Other pulmonary embolism without acute cor pulmonale: Secondary | ICD-10-CM | POA: Diagnosis not present

## 2020-01-04 DIAGNOSIS — F1010 Alcohol abuse, uncomplicated: Secondary | ICD-10-CM | POA: Diagnosis present

## 2020-01-04 DIAGNOSIS — R52 Pain, unspecified: Secondary | ICD-10-CM | POA: Diagnosis not present

## 2020-01-04 HISTORY — DX: Other pulmonary embolism without acute cor pulmonale: I26.99

## 2020-01-04 LAB — C-REACTIVE PROTEIN: CRP: 11.9 mg/dL — ABNORMAL HIGH (ref <1.0)

## 2020-01-04 LAB — FERRITIN: Ferritin: 56 ng/mL (ref 11–307)

## 2020-01-04 LAB — HEPARIN LEVEL (UNFRACTIONATED): Heparin Unfractionated: 0.17 IU/mL — ABNORMAL LOW (ref 0.30–0.70)

## 2020-01-04 LAB — LACTATE DEHYDROGENASE: LDH: 131 U/L (ref 98–192)

## 2020-01-04 LAB — D-DIMER, QUANTITATIVE: D-Dimer, Quant: 4.5 ug/mL-FEU — ABNORMAL HIGH (ref 0.00–0.50)

## 2020-01-04 LAB — FIBRINOGEN: Fibrinogen: 627 mg/dL — ABNORMAL HIGH (ref 210–475)

## 2020-01-04 LAB — HIV ANTIBODY (ROUTINE TESTING W REFLEX): HIV Screen 4th Generation wRfx: NONREACTIVE

## 2020-01-04 LAB — MAGNESIUM: Magnesium: 1.9 mg/dL (ref 1.7–2.4)

## 2020-01-04 LAB — TROPONIN I (HIGH SENSITIVITY): Troponin I (High Sensitivity): 5 ng/L (ref <18)

## 2020-01-04 LAB — PROCALCITONIN: Procalcitonin: <0.1 ng/mL

## 2020-01-04 MED ORDER — METHYLPREDNISOLONE SODIUM SUCC 40 MG IJ SOLR
0.5000 mg/kg | Freq: Two times a day (BID) | INTRAMUSCULAR | Status: AC
  Administered 2020-01-04 – 2020-01-06 (×6): 36.4 mg via INTRAVENOUS
  Filled 2020-01-04 (×6): qty 1

## 2020-01-04 MED ORDER — ONDANSETRON HCL 4 MG/2ML IJ SOLN
4.0000 mg | Freq: Once | INTRAMUSCULAR | Status: AC
  Administered 2020-01-04: 4 mg via INTRAVENOUS
  Filled 2020-01-04: qty 2

## 2020-01-04 MED ORDER — SODIUM CHLORIDE 0.9 % IV SOLN
100.0000 mg | Freq: Every day | INTRAVENOUS | Status: AC
  Administered 2020-01-05 – 2020-01-08 (×4): 100 mg via INTRAVENOUS
  Filled 2020-01-04 (×4): qty 20

## 2020-01-04 MED ORDER — ONDANSETRON HCL 4 MG PO TABS
4.0000 mg | ORAL_TABLET | Freq: Four times a day (QID) | ORAL | Status: DC | PRN
  Filled 2020-01-04: qty 1

## 2020-01-04 MED ORDER — HEPARIN (PORCINE) 25000 UT/250ML-% IV SOLN
1450.0000 [IU]/h | INTRAVENOUS | Status: DC
  Administered 2020-01-04: 1100 [IU]/h via INTRAVENOUS
  Administered 2020-01-05: 1450 [IU]/h via INTRAVENOUS
  Filled 2020-01-04 (×3): qty 250

## 2020-01-04 MED ORDER — IOHEXOL 350 MG/ML SOLN
75.0000 mL | Freq: Once | INTRAVENOUS | Status: AC | PRN
  Administered 2020-01-04: 75 mL via INTRAVENOUS

## 2020-01-04 MED ORDER — POTASSIUM CHLORIDE 10 MEQ/100ML IV SOLN
10.0000 meq | Freq: Once | INTRAVENOUS | Status: AC
  Administered 2020-01-04: 10 meq via INTRAVENOUS
  Filled 2020-01-04: qty 100

## 2020-01-04 MED ORDER — SODIUM CHLORIDE 0.9 % IV SOLN: 250.0000 mL | INTRAVENOUS | Status: DC | PRN

## 2020-01-04 MED ORDER — BISACODYL 5 MG PO TBEC
5.0000 mg | DELAYED_RELEASE_TABLET | Freq: Every day | ORAL | Status: DC | PRN
  Administered 2020-01-04 – 2020-01-09 (×4): 5 mg via ORAL
  Filled 2020-01-04 (×4): qty 1

## 2020-01-04 MED ORDER — HYDROMORPHONE HCL 1 MG/ML IJ SOLN
0.5000 mg | Freq: Once | INTRAMUSCULAR | Status: AC
  Administered 2020-01-04: 0.5 mg via INTRAVENOUS
  Filled 2020-01-04: qty 1

## 2020-01-04 MED ORDER — GUAIFENESIN-DM 100-10 MG/5ML PO SYRP
10.0000 mL | ORAL_SOLUTION | ORAL | Status: DC | PRN
  Administered 2020-01-09 – 2020-01-10 (×2): 10 mL via ORAL
  Filled 2020-01-04 (×2): qty 10

## 2020-01-04 MED ORDER — ACETAMINOPHEN 325 MG PO TABS
650.0000 mg | ORAL_TABLET | Freq: Four times a day (QID) | ORAL | Status: DC | PRN
  Administered 2020-01-05: 650 mg via ORAL
  Filled 2020-01-04: qty 2

## 2020-01-04 MED ORDER — HEPARIN BOLUS VIA INFUSION
4000.0000 [IU] | Freq: Once | INTRAVENOUS | Status: AC
  Administered 2020-01-04: 4000 [IU] via INTRAVENOUS
  Filled 2020-01-04: qty 4000

## 2020-01-04 MED ORDER — ALBUTEROL SULFATE HFA 108 (90 BASE) MCG/ACT IN AERS
2.0000 | INHALATION_SPRAY | RESPIRATORY_TRACT | Status: DC | PRN
  Filled 2020-01-04: qty 6.7

## 2020-01-04 MED ORDER — SODIUM CHLORIDE 0.9% FLUSH
3.0000 mL | Freq: Two times a day (BID) | INTRAVENOUS | Status: DC
  Administered 2020-01-04 – 2020-01-10 (×11): 3 mL via INTRAVENOUS

## 2020-01-04 MED ORDER — SODIUM CHLORIDE 0.9 % IV SOLN: 200.0000 mg | Freq: Once | INTRAVENOUS | Status: DC

## 2020-01-04 MED ORDER — PREDNISONE 20 MG PO TABS
50.0000 mg | ORAL_TABLET | Freq: Every day | ORAL | Status: DC
  Administered 2020-01-08 – 2020-01-10 (×3): 50 mg via ORAL
  Filled 2020-01-04 (×3): qty 2

## 2020-01-04 MED ORDER — SODIUM CHLORIDE 0.9% FLUSH: 3.0000 mL | INTRAVENOUS | Status: DC | PRN

## 2020-01-04 MED ORDER — SODIUM CHLORIDE 0.9 % IV SOLN: 100.0000 mg | Freq: Every day | INTRAVENOUS | Status: DC

## 2020-01-04 MED ORDER — SODIUM CHLORIDE 0.9 % IV SOLN
100.0000 mg | INTRAVENOUS | Status: AC
  Administered 2020-01-04 (×2): 100 mg via INTRAVENOUS
  Filled 2020-01-04 (×4): qty 20

## 2020-01-04 MED ORDER — ZINC SULFATE 220 (50 ZN) MG PO CAPS
220.0000 mg | ORAL_CAPSULE | Freq: Every day | ORAL | Status: DC
  Administered 2020-01-04 – 2020-01-10 (×7): 220 mg via ORAL
  Filled 2020-01-04 (×7): qty 1

## 2020-01-04 MED ORDER — ACETAMINOPHEN 500 MG PO TABS
1000.0000 mg | ORAL_TABLET | Freq: Once | ORAL | Status: AC
  Administered 2020-01-04: 1000 mg via ORAL
  Filled 2020-01-04: qty 2

## 2020-01-04 MED ORDER — POLYETHYLENE GLYCOL 3350 17 G PO PACK
17.0000 g | PACK | Freq: Every day | ORAL | Status: DC | PRN
  Administered 2020-01-06 – 2020-01-09 (×3): 17 g via ORAL
  Filled 2020-01-04 (×3): qty 1

## 2020-01-04 MED ORDER — HEPARIN BOLUS VIA INFUSION
2000.0000 [IU] | Freq: Once | INTRAVENOUS | Status: AC
  Administered 2020-01-04: 2000 [IU] via INTRAVENOUS
  Filled 2020-01-04: qty 2000

## 2020-01-04 MED ORDER — ASCORBIC ACID 500 MG PO TABS
500.0000 mg | ORAL_TABLET | Freq: Every day | ORAL | Status: DC
  Administered 2020-01-04 – 2020-01-10 (×7): 500 mg via ORAL
  Filled 2020-01-04 (×7): qty 1

## 2020-01-04 MED ORDER — HYDROCOD POLST-CPM POLST ER 10-8 MG/5ML PO SUER
5.0000 mL | Freq: Two times a day (BID) | ORAL | Status: DC | PRN
  Administered 2020-01-04 – 2020-01-09 (×8): 5 mL via ORAL
  Filled 2020-01-04 (×8): qty 5

## 2020-01-04 MED ORDER — OXYCODONE HCL 5 MG PO TABS
5.0000 mg | ORAL_TABLET | ORAL | Status: DC | PRN
  Administered 2020-01-04 – 2020-01-08 (×11): 5 mg via ORAL
  Filled 2020-01-04 (×12): qty 1

## 2020-01-04 MED ORDER — POTASSIUM CHLORIDE CRYS ER 20 MEQ PO TBCR
40.0000 meq | EXTENDED_RELEASE_TABLET | Freq: Once | ORAL | Status: AC
  Administered 2020-01-04: 40 meq via ORAL
  Filled 2020-01-04: qty 2

## 2020-01-04 MED ORDER — ONDANSETRON HCL 4 MG/2ML IJ SOLN: 4.0000 mg | Freq: Four times a day (QID) | INTRAMUSCULAR | Status: DC | PRN

## 2020-01-04 MED ORDER — FLEET ENEMA 7-19 GM/118ML RE ENEM
1.0000 | ENEMA | Freq: Once | RECTAL | Status: AC | PRN
  Administered 2020-01-07: 1 via RECTAL
  Filled 2020-01-04: qty 1

## 2020-01-04 MED ORDER — NICOTINE 14 MG/24HR TD PT24
14.0000 mg | MEDICATED_PATCH | Freq: Every day | TRANSDERMAL | Status: DC
  Administered 2020-01-04 – 2020-01-10 (×7): 14 mg via TRANSDERMAL
  Filled 2020-01-04 (×7): qty 1

## 2020-01-04 MED ORDER — KETOROLAC TROMETHAMINE 30 MG/ML IJ SOLN
30.0000 mg | Freq: Four times a day (QID) | INTRAMUSCULAR | Status: DC | PRN
  Administered 2020-01-04 (×2): 30 mg via INTRAVENOUS
  Filled 2020-01-04 (×2): qty 1

## 2020-01-04 NOTE — Progress Notes (Signed)
ANTICOAGULATION CONSULT NOTE - Initial Consult  Pharmacy Consult for heparin Indication: pulmonary embolus  No Known Allergies  Patient Measurements: Height: 5\' 2"  (157.5 cm) Weight: 72.6 kg (160 lb) IBW/kg (Calculated) : 50.1 Heparin Dosing Weight: 65.6kg  Vital Signs: Temp: 98.7 F (37.1 C) (09/16 0543) Temp Source: Oral (09/16 0543) BP: 143/98 (09/16 1015) Pulse Rate: 68 (09/16 1015)  Labs: Recent Labs    01/03/20 2207 01/04/20 0028  HGB 13.9  --   HCT 41.0  --   PLT 317  --   CREATININE 0.80  --   TROPONINIHS 5 5    Estimated Creatinine Clearance: 91.6 mL/min (by C-G formula based on SCr of 0.8 mg/dL).   Medical History: Past Medical History:  Diagnosis Date  . Asthma   . COVID-19     Medications:  Infusions:  . heparin      Assessment: Ann Mcmillan presented to the ED with SOB and CP. She was recently found to be COVID positive. Found to have a bilateral PE and now starting on IV heparin. Baseline CBC is WNL. She is not on anticoagulation PTA.   Goal of Therapy:  Heparin level 0.3-0.7 units/ml Monitor platelets by anticoagulation protocol: Yes   Plan:  Heparin bolus 4000 units IV x 1 Heparin gtt 1100 units/hr Check a 6 hr heparin level Daily heparin level and CBC  Mouhamed Glassco, 01/06/20 01/04/2020,10:57 AM

## 2020-01-04 NOTE — Progress Notes (Signed)
ANTICOAGULATION CONSULT NOTE - Follow Up Consult  Pharmacy Consult for Heparin Indication: pulmonary embolus  No Known Allergies  Patient Measurements: Height: 5\' 2"  (157.5 cm) Weight: 72.6 kg (160 lb) IBW/kg (Calculated) : 50.1 Heparin Dosing Weight: 65.6 kg  Vital Signs: Temp: 98.1 F (36.7 C) (09/16 1439) BP: 156/117 (09/16 1439) Pulse Rate: 79 (09/16 1439)  Labs: Recent Labs    01/03/20 2207 01/04/20 0028 01/04/20 1832  HGB 13.9  --   --   HCT 41.0  --   --   PLT 317  --   --   HEPARINUNFRC  --   --  0.17*  CREATININE 0.80  --   --   TROPONINIHS 5 5  --     Estimated Creatinine Clearance: 91.6 mL/min (by C-G formula based on SCr of 0.8 mg/dL).   Medical History: Past Medical History:  Diagnosis Date  . Asthma   . COVID-19   . Pulmonary emboli (HCC) 01/04/2020    Assessment: 34 yr old female presented to the ED with SOB and CP. She was recently found to be COVID positive (9/7). Pt was found to have a bilateral PE without RHS and now starting on IV heparin. Baseline CBC (9/15) is WNL; d-dimer 4.5. She was not on anticoagulation PTA.   Initial heparin level ~7.5 hrs after heparin 4000 units IV bolus X 1, followed by heparin infusion at 1100 units/hr, was 0.17 units/ml, which is below the goal range for this pt. Per RN, no issues with IV or bleeding observed.  Goal of Therapy:  Heparin level 0.3-0.7 units/ml Monitor platelets by anticoagulation protocol: Yes   Plan:  Heparin bolus 2000 units IV x 1 Increase heparin infusion to 1300 units/hr Check 6-hr heparin level Monitor heparin level and CBC Monitor for signs/symptoms of bleeding F/U transition to oral anticoagulant when able  12-03-1991, PharmD, BCPS, [facility] Clinical Pharmacist 01/04/2020,7:18 PM

## 2020-01-04 NOTE — ED Provider Notes (Signed)
[facility] EMERGENCY DEPARTMENT Provider Notation   CSN: [id number] Arrival date & time: 01/03/20  2143     History Chief Complaint  Patient presents with   COVID    Ann Mcmillan is a 34 y.o. female.  HPI  Patient is a 34 year old female with past medical history significant for asthma and COVID-19 diagnosed this past Saturday.  She states that she has been having symptoms since September 5.  She states that she felt that she was getting somewhat better from the congestion, cough, malaise, fatigue and muscle cramps which she states for the past 7 days she has been having worsening shortness of breath, muscle aches and states that her right calf is really bothering her.  She states that she has had a couple episodes of diarrhea and at least one ep of vomiting.  She states she is not vaccinated.  She states that she has been coughing with only clear sputum denies any hemoptysis.  She states her primary concern was brought her to the emergency department today was that she has had worsening sharp stabbing pleuritic chest pain that seems to move around in her chest but is primarily on the left side.  She states that it is often under her left breast and hurts consistently even if she takes a deep breath.  She denies any history of blood clots personally but states that she has family members who have had blood clots in your lungs and legs.  She has never had any testing for protein C or S deficiency.  Or any other hypercoagulable states.  She states that she is not pregnant.  She states that she has felt short of breath for the past 7 days as well as having the chest pain which seems to be getting progressively worse.  She denies any fever.  She states that she has been taking some ibuprofen at home.  States that she has not taken any Tylenol.  She has no history of kidney disease states that she has not had low potassium in the past.  symptoms since 9/5; Swab collected 9/7 and  resulted 9/11 +  Past Medical History:  Diagnosis Date   Asthma    COVID-19     There are no problems to display for this patient.   Past Surgical History:  Procedure Laterality Date   TOOTH EXTRACTION       OB History   No obstetric history on file.     No family history on file.  Social History   Tobacco Use   Smoking status: Current Every Day Smoker   Smokeless tobacco: Never Used  Substance Use Topics   Alcohol use: Yes   Drug use: Yes    Types: Marijuana    Home Medications Prior to Admission medications   Medication Sig Start Date End Date Taking? Authorizing Provider  amoxicillin (AMOXIL) 500 MG capsule Take 1 capsule (500 mg total) by mouth 3 (three) times daily. 05/03/19   Elson Areas, PA-C  Aspirin-Salicylamide-Caffeine (BC HEADACHE POWDER PO) Take 1 packet by mouth every 6 (six) hours as needed (for pain or toothaches). For pain    [provider]  diclofenac (VOLTAREN) 75 MG EC tablet Take 1 tablet (75 mg total) by mouth 2 (two) times daily. 05/03/19   Elson Areas, PA-C  ibuprofen (ADVIL,MOTRIN) 200 MG tablet Take 800 mg by mouth every 6 (six) hours as needed (for pain or toothaches).    [provider]  Ibuprofen-diphenhydrAMINE Cit (  MOTRIN PM) 200-38 MG TABS Take 1-2 tablets by mouth at bedtime as needed (for sleep).    [provider]  loratadine (CLARITIN) 10 MG tablet Take 1 tablet (10 mg total) by mouth daily. Patient not taking: Reported on 07/03/2017 07/28/16 05/03/19  [name], PA-C    Allergies    Patient has no known allergies.  Review of Systems   Review of Systems  Constitutional: Positive for chills and fatigue. Negative for fever.  HENT: Negative for congestion.   Eyes: Negative for pain.  Respiratory: Positive for cough and shortness of breath.   Cardiovascular: Positive for chest pain. Negative for leg swelling.  Gastrointestinal: Positive for diarrhea, nausea and vomiting. Negative for  abdominal pain and constipation.  Genitourinary: Negative for dysuria.  Musculoskeletal: Negative for myalgias.  Skin: Negative for rash.  Neurological: Negative for dizziness and headaches.    Physical Exam Updated Vital Signs BP (!) 142/102 (BP Location: Right Arm)    Pulse 71    Temp 98.7 F (37.1 C) (Oral)    Resp 16    SpO2 99%   Physical Exam Vitals and nursing note reviewed.  Constitutional:      General: She is in acute distress.     Comments: Overweight pleasant 34 female.  Able answer questions appropriately follow commands.  Somewhat anxious.  HENT:     Head: Normocephalic and atraumatic.     Nose: Nose normal.  Eyes:     General: No scleral icterus. Cardiovascular:     Rate and Rhythm: Regular rhythm. Tachycardia present.     Pulses: Normal pulses.     Heart sounds: Normal heart sounds.     Comments: Heart rate 96-104 Pulmonary:     Effort: Pulmonary effort is normal. No respiratory distress.     Breath sounds: Normal breath sounds. No wheezing.     Comments: Lungs are clear to auscultation all fields.  No wheezes rales or rhonchi.  Patient is slightly tachypneic with a respiratory rate of 22.  Satting 95-100% on room air.   TTP of the left chest Chest:     Chest wall: Tenderness present.  Abdominal:     Palpations: Abdomen is soft.     Tenderness: There is no abdominal tenderness. There is no guarding or rebound.  Musculoskeletal:     Cervical back: Normal range of motion.     Right lower leg: No edema.     Left lower leg: No edema.  Skin:    General: Skin is warm and dry.     Capillary Refill: Capillary refill takes less than 2 seconds.     Comments: Small metallic stud located cutaneously in anterior midline chest.  Neurological:     Mental Status: She is alert. Mental status is at baseline.  Psychiatric:        Mood and Affect: Mood normal.        Behavior: Behavior normal.     ED Results / Procedures / Treatments   Labs (all labs ordered are  listed, but only abnormal results are displayed) Labs Reviewed  BASIC METABOLIC PANEL - Abnormal; Notable for the following components:      Result Value   Sodium 134 (*)    Potassium 2.9 (*)    Glucose, Bld 128 (*)    Calcium 8.7 (*)    All other components within normal limits  CBC  MAGNESIUM  I-STAT BETA HCG BLOOD, ED ([facility], [facility], [facility] ONLY)  TROPONIN I (HIGH SENSITIVITY)  TROPONIN I (  HIGH SENSITIVITY)      EKG EKG Interpretation  Date/Time:  Wednesday January 03 2020 21:49:57 EDT Ventricular Rate:  96 PR Interval:  128 QRS Duration: 82 QT Interval:  370 QTC Calculation: 467 R Axis:   61 Text Interpretation: Normal sinus rhythm Minimal voltage criteria for LVH, may be normal variant ( Sokolow-Lyon ) Nonspecific ST and T wave abnormality Abnormal ECG No prior ECG for comparison. No STEMI Confirmed by Theda Belfast ([id number]) on 01/04/2020 7:33:59 AM   Radiology DG Chest Portable 1 View  Result Date: 01/03/2020 CLINICAL DATA:  Shortness of breath, COVID-19 positive 12/30/2019 EXAM: PORTABLE CHEST 1 VIEW COMPARISON:  None. FINDINGS: No consolidation, features of edema, pneumothorax, or effusion. Pulmonary vascularity is normally distributed. The cardiomediastinal contours are unremarkable. No acute osseous or soft tissue abnormality. Small radiodensity projecting over the mid chest, possibly external to the patient. IMPRESSION: No acute cardiopulmonary abnormality. Tiny metallic rounded density projecting over midline chest, likely external, correlate with visual inspection. Electronically Signed   By: Kreg Shropshire M.D.   On: 01/03/2020 22:15    Procedures .Critical Care Performed by: Gailen Shelter, PA Authorized by: Gailen Shelter, PA   Critical care provider statement:    Critical care time (minutes):  45   Critical care time was exclusive of:  Separately billable procedures and treating other patients and teaching time   Critical care was necessary to treat or prevent  imminent or life-threatening deterioration of the following conditions: pulmonary infarcts 2/2 PE with large clot burden.   Critical care was time spent personally by me on the following activities:  Discussions with consultants, evaluation of patient's response to treatment, examination of patient, review of old charts, re-evaluation of patient's condition, pulse oximetry, ordering and review of radiographic studies, ordering and review of laboratory studies and ordering and performing treatments and interventions   I assumed direction of critical care for this patient from another provider in my specialty: no     (including critical care time)  Medications Ordered in ED Medications  HYDROmorphone (DILAUDID) injection 0.5 mg (0.5 mg Intravenous Given 01/04/20 0827)  acetaminophen (TYLENOL) tablet 1,000 mg (1,000 mg Oral Given 01/04/20 0827)  ondansetron (ZOFRAN) injection 4 mg (4 mg Intravenous Given 01/04/20 0827)  potassium chloride SA (KLOR-CON) CR tablet 40 mEq (40 mEq Oral Given 01/04/20 0835)  potassium chloride 10 mEq in 100 mL IVPB (0 mEq Intravenous Stopped 01/04/20 1005)    ED Course  I have reviewed the triage vital signs and the nursing notes.  Pertinent labs & imaging results that were available during my care of the patient were reviewed by me and considered in my medical decision making (see chart for details).  Clinical Course as of Jan 04 1051  Thu Jan 04, 2020  4342 34 year old female tested positive for Covid 9/7 although she only found out results 9/11.  She has had symptoms since 9/5.  Seems to have had some second sickening with primary symptoms being shortness of breath and chest pain which is pleuritic and somewhat exertional.  Physical exam is notable for clear lungs, tachypnea, mild tachycardia, normal oxygenation by SPO2 but severe pain.  I am able to reproduce some pain with palpation however she has not been coughing significantly.  No hemoptysis but there is some  questionable family history of blood clots and she has not had hypercoagulable work-up in the past. I do have concerns for pulmonary embolism and would like to rule this out.  Troponins are within  normal limits x2.  ICG is negative for pregnancy.  She has no significant abdominal pain.  CBC without leukocytosis or anemia.  BMP with significantly low potassium of 2.9 likely secondary to GI losses from nausea and vomiting and diarrhea.  She has no evidence of AKI and creatinine is within normal limits.  Given my concern for PE will obtain CT PE rule out.  Magnesium added on which is within normal limits.  If PE study is negative will likely discharge home with conservative therapy, Zofran, p.o. potassium and magnesium supplements and close follow-up with PCP.   [WF]  1023 DG chest without any acute abnormality.  There is a small skin piercing on her chest which reflects in the x-ray.  Agree of radiology read no acute abnormality.   [WF]  1023 EKG without any abnormal ST-T wave abnormalities.  No evidence of ischemia.  No evidence of pericarditis.  Troponins within normal limits makes myocarditis unlikely.   [WF]  1023 Repeat EKG obtained and is without any significant changes.   [WF]  1049 Call from radiology to inform me of CT scan positive for PE.  I personally reviewed the CT images and agree of radiology.  Will initiate patient on heparin and admit.  She does have left lower lung infarct.   Acute lobar and segmental pulmonary emboli on both sides with occlusive clot and infarct in the left lower lobe. No right heart dilatation.    [WF]    Clinical Course User Index [WF] Gailen ShelterFondaw, Alysse Rathe S, PA   MDM Rules/Calculators/A&P                           I reevaluated patient 10:52 AM and informed her of the acute lobar and segmental pulmonary emboli.  Will admit patient to hospitalist service and initiate heparin.   Jiles Haroldboni Gustafson was evaluated in Emergency Department on 01/04/2020 for the symptoms  described in the history of present illness. She was evaluated in the context of the global COVID-19 pandemic, which necessitated consideration that the patient might be at risk for infection with the SARS-CoV-2 virus that causes COVID-19. Institutional protocols and algorithms that pertain to the evaluation of patients at risk for COVID-19 are in a state of rapid change based on information released by regulatory bodies including the CDC and federal and state organizations. These policies and algorithms were followed during the patient's care in the ED.  11:22 AM --discussed with Dr. Ophelia CharterYates who will admit patient to medicine.  Final Clinical Impression(s) / ED Diagnoses Final diagnoses:  COVID-19  Chest pain, unspecified type  SOB (shortness of breath)    Rx / DC Orders ED Discharge Orders    None       Gailen ShelterFondaw, Morene Cecilio S, [place] 01/04/20 1138    Tegeler, Canary Brimhristopher J, MD 01/04/20 [phone]

## 2020-01-04 NOTE — Progress Notes (Addendum)
BP 170/100, MAP 117 BP was taken after pt had been ambulating in room. Pt was asked to return to bed and rest. Will retake BP after pt has rested.

## 2020-01-04 NOTE — H&P (Signed)
History and Physical    Ann Mcmillan IRS:[id number] DOB: Jun 03, 1985 DOA: 01/03/2020  PCP: Patient, No Pcp Per Consultants:  None Patient coming from:  Home - lives with kids; NOK: Mother, Ann Mcmillan, [phone]  Chief Complaint: SOB with COVID  HPI: Ann Mcmillan is a 34 y.o. female with medical history significant of asthma presenting with SOB in the setting of COVID-19 infection.  She reports onset of symptoms on 9/5 with n/v, fatigue, congestion, cough, malaise, and muscle cramps.  + COVID test on 9/7.  She developed acute pleuritic CP with SOB over the last few days.    ED Course:  Diagnosed with COVID on 9/7, symptoms since 9/5.  Worsening CP and SOB, pleuritic, positional, exertional.  CTA shows significant clot burden with PE but no R heart strain.  Pain somewhat manageable.  Started on Heparin.  Has n/v/d and very low K+, given IV and PO repletion.   Review of Systems: As per HPI; otherwise review of systems reviewed and negative.   Ambulatory Status:  Ambulates without assistance  COVID Vaccine Status:  None  Past Medical History:  Diagnosis Date  . Asthma   . COVID-19   . Pulmonary emboli (HCC) 01/04/2020    Past Surgical History:  Procedure Laterality Date  . TOOTH EXTRACTION      Social History   Socioeconomic History  . Marital status: Single    Spouse name: Not on file  . Number of children: Not on file  . Years of education: Not on file  . Highest education level: Not on file  Occupational History  . Not on file  Tobacco Use  . Smoking status: Current Every Day Smoker    Types: Cigars  . Smokeless tobacco: Never Used  Substance and Sexual Activity  . Alcohol use: Yes  . Drug use: Not Currently    Types: Marijuana    Comment: denies  . Sexual activity: Not on file  Other Topics Concern  . Not on file  Social History Narrative  . Not on file   Social Determinants of Health   Financial Resource Strain:   . Difficulty of Paying Living  Expenses: Not on file  Food Insecurity:   . Worried About Programme researcher, broadcasting/film/video in the Last Year: Not on file  . Ran Out of Food in the Last Year: Not on file  Transportation Needs:   . Lack of Transportation (Medical): Not on file  . Lack of Transportation (Non-Medical): Not on file  Physical Activity:   . Days of Exercise per Week: Not on file  . Minutes of Exercise per Session: Not on file  Stress:   . Feeling of Stress : Not on file  Social Connections:   . Frequency of Communication with Friends and Family: Not on file  . Frequency of Social Gatherings with Friends and Family: Not on file  . Attends Religious Services: Not on file  . Active Member of Clubs or Organizations: Not on file  . Attends Banker Meetings: Not on file  . Marital Status: Not on file  Intimate Partner Violence:   . Fear of Current or Ex-Partner: Not on file  . Emotionally Abused: Not on file  . Physically Abused: Not on file  . Sexually Abused: Not on file    No Known Allergies  History reviewed. No pertinent family history.  Prior to Admission medications   Medication Sig Start Date End Date Taking? Authorizing Provider  amoxicillin (AMOXIL) 500 MG capsule Take  1 capsule (500 mg total) by mouth 3 (three) times daily. 05/03/19   Elson Areas, PA-C  Aspirin-Salicylamide-Caffeine (BC HEADACHE POWDER PO) Take 1 packet by mouth every 6 (six) hours as needed (for pain or toothaches). For pain    [provider]  diclofenac (VOLTAREN) 75 MG EC tablet Take 1 tablet (75 mg total) by mouth 2 (two) times daily. 05/03/19   Elson Areas, PA-C  ibuprofen (ADVIL,MOTRIN) 200 MG tablet Take 800 mg by mouth every 6 (six) hours as needed (for pain or toothaches).    [provider]  Ibuprofen-diphenhydrAMINE Cit (MOTRIN PM) 200-38 MG TABS Take 1-2 tablets by mouth at bedtime as needed (for sleep).    [provider]  loratadine (CLARITIN) 10 MG tablet Take 1 tablet (10 mg total)  by mouth daily. Patient not taking: Reported on 07/03/2017 07/28/16 05/03/19  [name], PA-C    Physical Exam: Vitals:   01/04/20 0543 01/04/20 0945 01/04/20 1015 01/04/20 1045  BP: (!) 142/102  (!) 143/98 (!) 141/90  Pulse: 82 71 68 86  Resp: 18 16 20 16   Temp: 98.7 F (37.1 C)     TempSrc: Oral     SpO2: 100% 99% 97% 95%  Weight:   72.6 kg   Height:   5\' 2"  (1.575 m)      . General:  Appears calm and comfortable and is NAD, on RA . Eyes:  PERRL, EOMI, normal lids, iris . ENT:  grossly normal hearing, lips & tongue, mmm . Neck:  no LAD, masses or thyromegaly . Cardiovascular:  RRR, no m/r/g. No LE edema.  Respiratory:   CTA bilaterally with no wheezes/rales/rhonchi.  Normal respiratory effort. . Abdomen:  soft, NT, ND, NABS . Skin:  no rash or induration seen on limited exam . Musculoskeletal:  grossly normal tone BUE/BLE, good ROM, no bony abnormality . Psychiatric:  grossly normal mood and affect, speech fluent and appropriate, AOx3; periodically hystrionic . Neurologic:  CN 2-12 grossly intact, moves all extremities in coordinated fashion    Radiological Exams on Admission: CT Angio Chest PE W/Cm &/Or Wo Cm  Result Date: 01/04/2020 CLINICAL DATA:  High probability for pulmonary embolism EXAM: CT ANGIOGRAPHY CHEST WITH CONTRAST TECHNIQUE: Multidetector CT imaging of the chest was performed using the standard protocol during bolus administration of intravenous contrast. Multiplanar CT image reconstructions and MIPs were obtained to evaluate the vascular anatomy. CONTRAST:  62mL OMNIPAQUE IOHEXOL 350 MG/ML SOLN COMPARISON:  None. FINDINGS: Cardiovascular: Bilateral lobar pulmonary emboli, involving all lobes on the left and involving the lower lobe on the right. Segmental clot is seen at the right upper lobe anteriorly. Basilar segment infarct posteriorly on the left with small reactive appearing effusion. Negative for failure. Normal RV to LV ratio. No pericardial effusion.  Negative aorta. Mediastinum/Nodes: Negative for adenopathy or mass Lungs/Pleura: Ground-glass opacity in a segmental distribution at the left base with trace effusion. Upper Abdomen: Negative Musculoskeletal: Negative Review of the MIP images confirms the above findings. Critical Value/emergent results were called by telephone at the time of interpretation on 01/04/2020 at 10:41 am to provider [facility] , who verbally acknowledged these results. IMPRESSION: Acute lobar and segmental pulmonary emboli on both sides with occlusive clot and infarct in the left lower lobe. No right heart dilatation. Electronically Signed   By: 01/06/2020 M.D.   On: 01/04/2020 10:43   DG Chest Portable 1 View  Result Date: 01/03/2020 CLINICAL DATA:  Shortness of breath, COVID-19 positive  12/30/2019 EXAM: PORTABLE CHEST 1 VIEW COMPARISON:  None. FINDINGS: No consolidation, features of edema, pneumothorax, or effusion. Pulmonary vascularity is normally distributed. The cardiomediastinal contours are unremarkable. No acute osseous or soft tissue abnormality. Small radiodensity projecting over the mid chest, possibly external to the patient. IMPRESSION: No acute cardiopulmonary abnormality. Tiny metallic rounded density projecting over midline chest, likely external, correlate with visual inspection. Electronically Signed   By: Kreg ShropshirePrice  DeHay M.D.   On: 01/03/2020 22:15    EKG: Independently reviewed.  NSR with rate 88; LVH with no evidence of acute ischemia   Labs on Admission: I have personally reviewed the available labs and imaging studies at the time of the admission.  Pertinent labs:   9/7 COVID POSITIVE K+ 2.9 Glucose 128 HS troponin 5, 5 Normal CBC HCG <5 LDH 131 Ferritin 56 CRP 11.9 Procalcitonin <0.10 D-dimer 4.50 Fibrinogen 627   Assessment/Plan Principal Problem:   Pulmonary embolism associated with COVID-19 (HCC) Active Problems:   Tobacco dependence   Hypokalemia   PE -Patient without  prior episodes of thromboembolic disease presenting with new extensive PE -This is in the setting of active COVID-19 infection and so was provoked -Will admit on telemetry -Initiate anticoagulation - for now, will start treatment-dose Heparin -She is likely able [facility] if her COVID infection is stable -Smoking cessation has been strongly encouraged -She does not appear to have insurance and so may need Coumadin due to cost reasons; will request TOC assistance for PCP and medications  COVID-19 Infection -Patient with presenting with SOB in the setting of previously-diagnosed COVID-19 infection -Mild anorexia, n/v noted -She does not have an O2 requirement despite the substantial clot burden -COVID POSITIVE -Pertinent labs concerning for COVID include normal WBC count; markedly elevated D-dimer (>1 - but also with PE); low procalcitonin; markedly elevated CRP (>7); increased fibrinogen -Will not treat with Mcmillan-spectrum antibiotics given procalcitonin <0.5 -Will admit for further evaluation, close monitoring, and treatment -Monitor on telemetry x at least 24 hours -At this time, will attempt to avoid use of aerosolized medications and use HFAs instead -Will check daily labs including BMP with Mag, Phos; LFTs; CBC with differential; CRP; ferritin; fibrinogen; D-dimer -Will order steroids and Remdesivir (pharmacy consult) given +COVID test and despite no hypoxia due to significant VTE event which may be suggestive of a larger infection burden -If the patient shows clinical deterioration, consider transfer to ICU with PCCM consultation -Will attempt to maintain euvolemia to a net negative fluid status -Will ask the patient to maintain an awake prone position for 16+ hours a day, if possible, with a minimum of 2-3 hours at a time -Patient was seen wearing full PPE including: gown, gloves, head cover, N95, and face shield; donning and doffing was in compliance  with current standards.  Hypokalemia -Likely from GI losses -Repleted in ER.   -Will follow.    Tobacco dependence -Tobacco Dependence: encourage cessation.   -This was discussed with the patient and should be reviewed on an ongoing basis.   -Patch ordered       Note: This patient has been tested and is negative for the novel coronavirus COVID-19.    DVT prophylaxis:  Heparin  Code Status:  Full - confirmed with patient Family Communication: None present; I spoke with the patient's mother by telephone. Disposition Plan:  The patient is from: home  [facility] once [facility] issues have been resolved.   Anticipated d/c  date will depend on clinical response to treatment, likely between 2 days (with completion of outpatient Remdesivir treatment) and 5 days  Patient is currently: acutely ill Consults called: None  Admission status: Admit - It is my clinical opinion that admission to INPATIENT is reasonable and necessary because of the expectation that this patient will require hospital care that crosses at least 2 midnights to treat this condition based on the medical complexity of the problems presented.  Given the aforementioned information, the predictability of an adverse outcome is felt to be significant.      Jonah Blue MD [facility]   How to contact the [facility] Attending or Consulting provider 7A - 7P or covering provider during after hours 7P -7A, for this patient?  1. Check the care team in [facility] and look for a) attending/consulting [facility] provider listed and b) the [facility] team listed 2. Log into [email or web address] and use [facility]'s universal password to access. If you do not have the password, please contact the hospital operator. 3. Locate the [facility] provider you are looking for under [facility] and page to a number that you can be directly reached. 4. If you still have difficulty reaching the provider, please page the [facility]  (Director on Call) for the Hospitalists listed on amion for assistance.   01/04/2020, 2:03 PM

## 2020-01-04 NOTE — Plan of Care (Signed)

## 2020-01-05 ENCOUNTER — Inpatient Hospital Stay (HOSPITAL_COMMUNITY): Payer: Medicaid Other

## 2020-01-05 DIAGNOSIS — I2699 Other pulmonary embolism without acute cor pulmonale: Secondary | ICD-10-CM

## 2020-01-05 DIAGNOSIS — R0602 Shortness of breath: Secondary | ICD-10-CM

## 2020-01-05 DIAGNOSIS — U071 COVID-19: Secondary | ICD-10-CM

## 2020-01-05 DIAGNOSIS — M7989 Other specified soft tissue disorders: Secondary | ICD-10-CM

## 2020-01-05 DIAGNOSIS — R52 Pain, unspecified: Secondary | ICD-10-CM

## 2020-01-05 DIAGNOSIS — R079 Chest pain, unspecified: Secondary | ICD-10-CM

## 2020-01-05 LAB — HEPARIN LEVEL (UNFRACTIONATED)
Heparin Unfractionated: 0.34 IU/mL (ref 0.30–0.70)
Heparin Unfractionated: 0.3 IU/mL (ref 0.30–0.70)

## 2020-01-05 LAB — CBC
HCT: 41 % (ref 36.0–46.0)
HCT: 41.2 % (ref 36.0–46.0)
Hemoglobin: 13.7 g/dL (ref 12.0–15.0)
Hemoglobin: 14.1 g/dL (ref 12.0–15.0)
MCH: 30.7 pg (ref 26.0–34.0)
MCH: 31.8 pg (ref 26.0–34.0)
MCHC: 33.4 g/dL (ref 30.0–36.0)
MCHC: 34.2 g/dL (ref 30.0–36.0)
MCV: 91.9 fL (ref 80.0–100.0)
MCV: 93 fL (ref 80.0–100.0)
Platelets: 303 10*3/uL (ref 150–400)
Platelets: 327 10*3/uL (ref 150–400)
RBC: 4.46 MIL/uL (ref 3.87–5.11)
RBC: 4.43 MIL/uL (ref 3.87–5.11)
RDW: 11.9 % (ref 11.5–15.5)
RDW: 11.9 % (ref 11.5–15.5)
WBC: 11.6 10*3/uL — ABNORMAL HIGH (ref 4.0–10.5)
WBC: 13.5 10*3/uL — ABNORMAL HIGH (ref 4.0–10.5)
nRBC: 0 % (ref 0.0–0.2)
nRBC: 0 % (ref 0.0–0.2)

## 2020-01-05 LAB — COMPREHENSIVE METABOLIC PANEL
ALT: 22 U/L (ref 0–44)
ALT: 25 U/L (ref 0–44)
AST: 27 U/L (ref 15–41)
AST: 27 U/L (ref 15–41)
Albumin: 3.1 g/dL — ABNORMAL LOW (ref 3.5–5.0)
Albumin: 2.8 g/dL — ABNORMAL LOW (ref 3.5–5.0)
Alkaline Phosphatase: 62 U/L (ref 38–126)
Alkaline Phosphatase: 61 U/L (ref 38–126)
Anion gap: 10 (ref 5–15)
Anion gap: 8 (ref 5–15)
BUN: 7 mg/dL (ref 6–20)
BUN: 6 mg/dL (ref 6–20)
CO2: 24 mmol/L (ref 22–32)
CO2: 25 mmol/L (ref 22–32)
Calcium: 9 mg/dL (ref 8.9–10.3)
Calcium: 9 mg/dL (ref 8.9–10.3)
Chloride: 102 mmol/L (ref 98–111)
Chloride: 100 mmol/L (ref 98–111)
Creatinine, Ser: 0.66 mg/dL (ref 0.44–1.00)
Creatinine, Ser: 0.64 mg/dL (ref 0.44–1.00)
GFR calc Af Amer: >60 mL/min (ref >60)
GFR calc Af Amer: >60 mL/min (ref >60)
GFR calc non Af Amer: >60 mL/min (ref >60)
GFR calc non Af Amer: >60 mL/min (ref >60)
Glucose, Bld: 128 mg/dL — ABNORMAL HIGH (ref 70–99)
Glucose, Bld: 181 mg/dL — ABNORMAL HIGH (ref 70–99)
Potassium: 3.9 mmol/L (ref 3.5–5.1)
Potassium: 3.5 mmol/L (ref 3.5–5.1)
Sodium: 136 mmol/L (ref 135–145)
Sodium: 133 mmol/L — ABNORMAL LOW (ref 135–145)
Total Bilirubin: 0.6 mg/dL (ref 0.3–1.2)
Total Bilirubin: 0.5 mg/dL (ref 0.3–1.2)
Total Protein: 7.5 g/dL (ref 6.5–8.1)
Total Protein: 7.3 g/dL (ref 6.5–8.1)

## 2020-01-05 LAB — D-DIMER, QUANTITATIVE: D-Dimer, Quant: 6.16 ug/mL-FEU — ABNORMAL HIGH (ref 0.00–0.50)

## 2020-01-05 LAB — PHOSPHORUS
Phosphorus: 2.6 mg/dL (ref 2.5–4.6)
Phosphorus: 3.3 mg/dL (ref 2.5–4.6)

## 2020-01-05 LAB — C-REACTIVE PROTEIN: CRP: 19.6 mg/dL — ABNORMAL HIGH (ref <1.0)

## 2020-01-05 LAB — ECHOCARDIOGRAM COMPLETE
Area-P 1/2: 2.56 cm2
Height: 62 in
S' Lateral: 3.91 cm
Weight: 2560 oz

## 2020-01-05 LAB — MAGNESIUM
Magnesium: 1.8 mg/dL (ref 1.7–2.4)
Magnesium: 1.8 mg/dL (ref 1.7–2.4)

## 2020-01-05 LAB — FERRITIN: Ferritin: 79 ng/mL (ref 11–307)

## 2020-01-05 MED ORDER — ADULT MULTIVITAMIN W/MINERALS CH
1.0000 | ORAL_TABLET | Freq: Every day | ORAL | Status: DC
  Administered 2020-01-05 – 2020-01-10 (×6): 1 via ORAL
  Filled 2020-01-05 (×6): qty 1

## 2020-01-05 MED ORDER — LORAZEPAM 2 MG/ML IJ SOLN: 1.0000 mg | INTRAMUSCULAR | Status: DC | PRN

## 2020-01-05 MED ORDER — THIAMINE HCL 100 MG/ML IJ SOLN
100.0000 mg | Freq: Every day | INTRAMUSCULAR | Status: DC
  Administered 2020-01-08: 100 mg via INTRAVENOUS
  Filled 2020-01-05 (×3): qty 2

## 2020-01-05 MED ORDER — ALPRAZOLAM 0.25 MG PO TABS
0.2500 mg | ORAL_TABLET | Freq: Once | ORAL | Status: AC
  Administered 2020-01-05: 0.25 mg via ORAL
  Filled 2020-01-05: qty 1

## 2020-01-05 MED ORDER — THIAMINE HCL 100 MG PO TABS
100.0000 mg | ORAL_TABLET | Freq: Every day | ORAL | Status: DC
  Administered 2020-01-05 – 2020-01-10 (×5): 100 mg via ORAL
  Filled 2020-01-05 (×6): qty 1

## 2020-01-05 MED ORDER — AMLODIPINE BESYLATE 5 MG PO TABS
5.0000 mg | ORAL_TABLET | Freq: Every day | ORAL | Status: DC
  Administered 2020-01-05 – 2020-01-06 (×2): 5 mg via ORAL
  Filled 2020-01-05 (×2): qty 1

## 2020-01-05 MED ORDER — HYDRALAZINE HCL 20 MG/ML IJ SOLN: 10.0000 mg | Freq: Four times a day (QID) | INTRAMUSCULAR | Status: DC | PRN

## 2020-01-05 MED ORDER — FOLIC ACID 1 MG PO TABS
1.0000 mg | ORAL_TABLET | Freq: Every day | ORAL | Status: DC
  Administered 2020-01-05 – 2020-01-10 (×6): 1 mg via ORAL
  Filled 2020-01-05 (×6): qty 1

## 2020-01-05 MED ORDER — LORAZEPAM 1 MG PO TABS
1.0000 mg | ORAL_TABLET | ORAL | Status: DC | PRN
  Administered 2020-01-05 – 2020-01-06 (×2): 2 mg via ORAL
  Administered 2020-01-07 – 2020-01-08 (×2): 1 mg via ORAL
  Filled 2020-01-05 (×2): qty 2
  Filled 2020-01-05 (×2): qty 1

## 2020-01-05 MED ORDER — ACETAMINOPHEN 500 MG PO TABS
1000.0000 mg | ORAL_TABLET | Freq: Three times a day (TID) | ORAL | Status: DC
  Administered 2020-01-05 – 2020-01-10 (×13): 1000 mg via ORAL
  Filled 2020-01-05 (×13): qty 2

## 2020-01-05 MED ORDER — BENZONATATE 100 MG PO CAPS
200.0000 mg | ORAL_CAPSULE | Freq: Three times a day (TID) | ORAL | Status: DC
  Administered 2020-01-05 – 2020-01-10 (×16): 200 mg via ORAL
  Filled 2020-01-05 (×16): qty 2

## 2020-01-05 NOTE — Progress Notes (Signed)
ANTICOAGULATION CONSULT NOTE - Follow Up Consult  Pharmacy Consult for heparin Indication: pulmonary embolus  Labs: Recent Labs    01/03/20 2207 01/04/20 0028 01/04/20 1832 01/05/20 0039 01/05/20 0130  HGB 13.9  --   --  13.7  --   HCT 41.0  --   --  41.0  --   PLT 317  --   --  303  --   HEPARINUNFRC  --   --  0.17*  --  0.34  CREATININE 0.80  --   --   --   --   TROPONINIHS 5 5  --   --   --     Assessment: 34yo female therapeutic on heparin but at very low end of goal, would prefer higher w/ acute VTE; no gtt issues or signs of bleeding per RN.  Goal of Therapy:  Heparin level 0.3-0.7 units/ml   Plan:  Will increase heparin gtt slightly to 1400 units/hr and check level in 6 hours.    Vernard Gambles, PharmD, BCPS  01/05/2020,3:13 AM

## 2020-01-05 NOTE — Progress Notes (Signed)
Bilateral Lower Ext. study completed.   See CVProc for preliminary results.   Nicolus Ose, RDMS, RVT 

## 2020-01-05 NOTE — Progress Notes (Signed)
   01/05/20 0028  Vitals  BP (!) 174/100  ECG Heart Rate 89  MEWS COLOR  MEWS Score Color Green  MEWS Score  MEWS Temp 0  MEWS Systolic 0  MEWS Pulse 0  MEWS RR 0  MEWS LOC 0  MEWS Score 0  Pt's BP retaken at a resting state. MD notified and new orders acknowledged. Upon reassessment, pt was found in pain, anxious, and tearful. Pt states the pain in her left chest gets worse with ambulation. This RN medicated pt for pain and anxiety.

## 2020-01-05 NOTE — Progress Notes (Signed)
°  Echocardiogram 2D Echocardiogram has been performed.  Celene Skeen 01/05/2020, 11:52 AM

## 2020-01-05 NOTE — Progress Notes (Signed)
ANTICOAGULATION CONSULT NOTE  Pharmacy Consult for heparin Indication: pulmonary embolus  No Known Allergies  Patient Measurements: Height: 5\' 2"  (157.5 cm) Weight: 72.6 kg (160 lb) IBW/kg (Calculated) : 50.1 Heparin Dosing Weight: 65.6kg  Vital Signs: Temp: 98.2 F (36.8 C) (09/17 0802) Temp Source: Oral (09/17 0802) BP: 167/103 (09/17 0802) Pulse Rate: 86 (09/17 0802)  Labs: Recent Labs    01/03/20 2207 01/03/20 2207 01/04/20 0028 01/04/20 1832 01/05/20 0039 01/05/20 0130 01/05/20 0954  HGB 13.9   < >  --   --  13.7  --  14.1  HCT 41.0  --   --   --  41.0  --  41.2  PLT 317  --   --   --  303  --  327  HEPARINUNFRC  --   --   --  0.17*  --  0.34 0.30  CREATININE 0.80  --   --   --  0.66  --   --   TROPONINIHS 5  --  5  --   --   --   --    < > = values in this interval not displayed.    Estimated Creatinine Clearance: 91.6 mL/min (by C-G formula based on SCr of 0.66 mg/dL).   Assessment: 21 YOF presented to the ED with SOB and CP. She was recently found to be COVID positive. Found to have a bilateral PE and started on IV heparin.  Heparin level is therapeutic and low normal; no bleeding reported.  Goal of Therapy:  Heparin level 0.3-0.7 units/ml Monitor platelets by anticoagulation protocol: Yes   Plan:  Increase heparin gtt slightly to 1450 units/hr Daily heparin level and CBC  Ann Mcmillan D. 31, PharmD, BCPS, BCCCP 01/05/2020, 10:54 AM

## 2020-01-05 NOTE — Progress Notes (Signed)
PROGRESS NOTE                                                                                                                                                                                                             Patient Demographics:    Ann Mcmillan, is a 34 y.o. female, DOB - 1985/10/03, QMG:[id number]  Outpatient Primary MD for the patient is Patient, No Pcp Per   Admit date - 01/03/2020   LOS - 1  Chief Complaint  Patient presents with  . COVID       Brief Narrative: Patient is a 34 y.o. female with PMHx of bronchial asthma-tested positive for COVID-19 on 9/7-presented with several days history of shortness of breath, chest pain-found to have pulmonary embolism in the setting of COVID-19 pneumonia-subsequently admitted to the hospitalist service.  COVID-19 vaccinated status: Unvaccinated  Significant Events: 9/7>> COVID-19 positive 9/15>> presented to [facility] with pleuritic chest pain, shortness of breath-found to have PE.  Significant studies: 9/15>>Chest x-ray: No acute cardiopulmonary abnormality. 9/16>> CTA chest: Acute lobar/segmental pulmonary emboli on both sides.  COVID-19 medications: Steroids: 9/16>> Remdesivir: 9/16>>  Antibiotics: None  Microbiology data: None  Procedures: None  Consults: None  DVT prophylaxis: Heparin infusion    Subjective:    [name] today feels essentially the same as yesterday-continues to have coughing spells-continues to have chest pain which is pleuritic and musculoskeletal (easily reproducible)   Assessment  & Plan :   Pulmonary embolism: Likely provoked by COVID-19-hemodynamically stable-no concerning findings for RV strain on physical exam (no JVD)-continue IV heparin.  Await echo/Doppler-if remains stable-could transition to oral anticoagulation in the next 24 hours.  She continues to have chest pain which is pleuritic as well as musculoskeletal from  coughing (easily reproducible with gentle palpation)  COVID-19 pneumonia: Not hypoxic-has infiltrates seen on CT chest-CRP significantly elevated-continue steroids/remdesivir.  Watch closely.    Fever: afebrile  O2 requirements:  SpO2: 95 %   COVID-19 Labs: Recent Labs    01/04/20 1146 01/05/20 0039  DDIMER 4.50* 6.16*  FERRITIN 56 79  LDH 131  --   CRP 11.9* 19.6*    No results found for: BNP  Recent Labs  Lab 01/04/20 1146  PROCALCITON <0.10    No results found for: SARSCOV2NAA    Prone/Incentive Spirometry: encouraged  incentive spirometry use 3-4/hour.  Elevated  BP without diagnosis of hypertension: Starting amlodipine and will follow BP trend.  Alcohol abuse: Claims she drinks 1 small bottle of Hennessy almost every day-claims that since she got sick-she is not had a drink-but not exactly sure when her last drink was.  Currently no signs of withdrawal-but will go and place on Ativan per CIWA protocol.  Watch closely.  Vent Settings: N/A  Condition - Stable  Family Communication  : Patient prefers to update family himself-I have asked her to let me know if family has questions.  Code Status :  Full Code  Diet :  Diet Order            Diet regular Room service appropriate? Yes; Fluid consistency: Thin  Diet effective now                  Disposition Plan  :   Status is: Inpatient  Remains inpatient appropriate because:Inpatient level of care appropriate due to severity of illness   Dispo: The patient is from: Home              Anticipated d/c is to: Home              Anticipated d/c date is: 2 days              Patient currently is not medically stable to d/c.    Barriers to discharge: COVID-19 pneumonia with PE-on IV anticoagulation and will need to complete 5 days of IV Remdesivir  Antimicorbials  :    Anti-infectives (From admission, onward)   Start     Dose/Rate Route Frequency Ordered Stop   01/05/20 1000  remdesivir 100 mg in sodium  chloride 0.9 % 100 mL IVPB  Status:  Discontinued       "Followed by" Linked Group Details   100 mg 200 mL/hr over 30 Minutes Intravenous Daily 01/04/20 1125 01/04/20 1135   01/05/20 1000  remdesivir 100 mg in sodium chloride 0.9 % 100 mL IVPB        100 mg 200 mL/hr over 30 Minutes Intravenous Daily 01/04/20 1136 01/09/20 0959   01/04/20 1200  remdesivir 100 mg in sodium chloride 0.9 % 100 mL IVPB        100 mg 200 mL/hr over 30 Minutes Intravenous Every 30 min 01/04/20 1136 01/04/20 1331   01/04/20 1130  remdesivir 200 mg in sodium chloride 0.9% 250 mL IVPB  Status:  Discontinued       "Followed by" Linked Group Details   200 mg 580 mL/hr over 30 Minutes Intravenous Once 01/04/20 1125 01/04/20 1135      Inpatient Medications  Scheduled Meds: . acetaminophen  1,000 mg Oral Q8H  . amLODipine  5 mg Oral Daily  . vitamin C  500 mg Oral Daily  . benzonatate  200 mg Oral TID  . folic acid  1 mg Oral Daily  . methylPREDNISolone (SOLU-MEDROL) injection  0.5 mg/kg Intravenous Q12H   Followed by  . [START ON 01/07/2020] predniSONE  50 mg Oral Daily  . multivitamin with minerals  1 tablet Oral Daily  . nicotine  14 mg Transdermal Daily  . sodium chloride flush  3 mL Intravenous Q12H  . thiamine  100 mg Oral Daily   Or  . thiamine  100 mg Intravenous Daily  . zinc sulfate  220 mg Oral Daily   Continuous Infusions: . sodium chloride    . heparin 1,400 Units/hr (01/05/20 0345)  . remdesivir 100 mg in  NS 100 mL 100 mg (01/05/20 0927)   PRN Meds:.sodium chloride, albuterol, bisacodyl, chlorpheniramine-HYDROcodone, guaiFENesin-dextromethorphan, hydrALAZINE, LORazepam **OR** LORazepam, ondansetron **OR** ondansetron (ZOFRAN) IV, oxyCODONE, polyethylene glycol, sodium chloride flush, sodium phosphate   Time Spent in minutes  25 See all Orders from today for further details   Jeoffrey Massed M.D on 01/05/2020 at 10:51 AM  To page go to [email or web address] - use universal password  [facility] -  Office  [phone]    Objective:   Vitals:   01/04/20 1439 01/04/20 2326 01/05/20 0028 01/05/20 0802  BP: (!) 156/117 (!) 170/100 (!) 174/100 (!) 167/103  Pulse: 79 71  86  Resp: 20 20  20   Temp: 98.1 F (36.7 C) 97.9 F (36.6 C)  98.2 F (36.8 C)  TempSrc:    Oral  SpO2: 97% 96%  95%  Weight:      Height:        Wt Readings from Last 3 Encounters:  01/04/20 72.6 kg  07/28/16 68.3 kg  02/20/14 62.1 kg     Intake/Output Summary (Last 24 hours) at 01/05/2020 1051 Last data filed at 01/05/2020 0107 Gross per 24 hour  Intake 751.97 ml  Output --  Net 751.97 ml     Physical Exam Gen Exam:Alert awake-not in any distress HEENT:atraumatic, normocephalic Chest: B/L clear to auscultation anteriorly CVS:S1S2 regular Abdomen:soft non tender, non distended Extremities:no edema Neurology: Non focal Skin: no rash   Data Review:    CBC Recent Labs  Lab 01/03/20 2207 01/05/20 0039 01/05/20 0954  WBC 7.2 11.6* 13.5*  HGB 13.9 13.7 14.1  HCT 41.0 41.0 41.2  PLT 317 303 327  MCV 91.7 91.9 93.0  MCH 31.1 30.7 31.8  MCHC 33.9 33.4 34.2  RDW 11.9 11.9 11.9    Chemistries  Recent Labs  Lab 01/03/20 2207 01/04/20 0837 01/05/20 0039  NA 134*  --  136  K 2.9*  --  3.9  CL 98  --  102  CO2 24  --  24  GLUCOSE 128*  --  128*  BUN 6  --  7  CREATININE 0.80  --  0.66  CALCIUM 8.7*  --  9.0  MG  --  1.9 1.8  AST  --   --  27  ALT  --   --  22  ALKPHOS  --   --  62  BILITOT  --   --  0.6   ------------------------------------------------------------------------------------------------------------------ No results for input(s): CHOL, HDL, LDLCALC, TRIG, CHOLHDL, LDLDIRECT in the last 72 hours.  No results found for: HGBA1C ------------------------------------------------------------------------------------------------------------------ No results for input(s): TSH, T4TOTAL, T3FREE, THYROIDAB in the last 72 hours.  Invalid input(s):  FREET3 ------------------------------------------------------------------------------------------------------------------ Recent Labs    01/04/20 1146 01/05/20 0039  FERRITIN 56 79    Coagulation profile No results for input(s): INR, PROTIME in the last 168 hours.  Recent Labs    01/04/20 1146 01/05/20 0039  DDIMER 4.50* 6.16*    Cardiac Enzymes No results for input(s): CKMB, TROPONINI, MYOGLOBIN in the last 168 hours.  Invalid input(s): CK ------------------------------------------------------------------------------------------------------------------ No results found for: BNP  Micro Results No results found for this or any previous visit (from the past 240 hour(s)).  Radiology Reports CT Angio Chest PE W/Cm &/Or Wo Cm  Result Date: 01/04/2020 CLINICAL DATA:  High probability for pulmonary embolism EXAM: CT ANGIOGRAPHY CHEST WITH CONTRAST TECHNIQUE: Multidetector CT imaging of the chest was performed using the standard protocol during bolus administration of intravenous contrast. Multiplanar CT image reconstructions  and MIPs were obtained to evaluate the vascular anatomy. CONTRAST:  75mL OMNIPAQUE IOHEXOL 350 MG/ML SOLN COMPARISON:  None. FINDINGS: Cardiovascular: Bilateral lobar pulmonary emboli, involving all lobes on the left and involving the lower lobe on the right. Segmental clot is seen at the right upper lobe anteriorly. Basilar segment infarct posteriorly on the left with small reactive appearing effusion. Negative for failure. Normal RV to LV ratio. No pericardial effusion. Negative aorta. Mediastinum/Nodes: Negative for adenopathy or mass Lungs/Pleura: Ground-glass opacity in a segmental distribution at the left base with trace effusion. Upper Abdomen: Negative Musculoskeletal: Negative Review of the MIP images confirms the above findings. Critical Value/emergent results were called by telephone at the time of interpretation on 01/04/2020 at 10:41 am to provider [facility] , who verbally acknowledged these results. IMPRESSION: Acute lobar and segmental pulmonary emboli on both sides with occlusive clot and infarct in the left lower lobe. No right heart dilatation. Electronically Signed   By: Marnee Spring M.D.   On: 01/04/2020 10:43   DG Chest Portable 1 View  Result Date: 01/03/2020 CLINICAL DATA:  Shortness of breath, COVID-19 positive 12/30/2019 EXAM: PORTABLE CHEST 1 VIEW COMPARISON:  None. FINDINGS: No consolidation, features of edema, pneumothorax, or effusion. Pulmonary vascularity is normally distributed. The cardiomediastinal contours are unremarkable. No acute osseous or soft tissue abnormality. Small radiodensity projecting over the mid chest, possibly external to the patient. IMPRESSION: No acute cardiopulmonary abnormality. Tiny metallic rounded density projecting over midline chest, likely external, correlate with visual inspection. Electronically Signed   By: Kreg Shropshire M.D.   On: 01/03/2020 22:15   VAS [place] LOWER EXTREMITY VENOUS (DVT)  Result Date: 01/05/2020  Lower Venous DVTStudy Indications: Pain, Swelling, and + Covid.  Risk Factors: None identified. Performing Technologist: Jannet Askew RCT RDMS  Examination Guidelines: A complete evaluation includes B-mode imaging, spectral Doppler, color Doppler, and power Doppler as needed of all accessible portions of each vessel. Bilateral testing is considered an integral part of a complete examination. Limited examinations for reoccurring indications may be performed as noted. The reflux portion of the exam is performed with the patient in reverse Trendelenburg.  +---------+---------------+---------+-----------+----------+--------------+ RIGHT    CompressibilityPhasicitySpontaneityPropertiesThrombus Aging +---------+---------------+---------+-----------+----------+--------------+ CFV      Full           Yes      Yes                                  +---------+---------------+---------+-----------+----------+--------------+ SFJ      Full                                                        +---------+---------------+---------+-----------+----------+--------------+ FV Prox  Full                                                        +---------+---------------+---------+-----------+----------+--------------+ FV Mid   Full                                                        +---------+---------------+---------+-----------+----------+--------------+  FV DistalFull                                                        +---------+---------------+---------+-----------+----------+--------------+ PFV      Full                                                        +---------+---------------+---------+-----------+----------+--------------+ POP      Full           Yes      Yes                                 +---------+---------------+---------+-----------+----------+--------------+ PTV      Full                                                        +---------+---------------+---------+-----------+----------+--------------+ PERO     Full                                                        +---------+---------------+---------+-----------+----------+--------------+   +---------+---------------+---------+-----------+----------+--------------+ LEFT     CompressibilityPhasicitySpontaneityPropertiesThrombus Aging +---------+---------------+---------+-----------+----------+--------------+ CFV      Full           Yes      Yes                                 +---------+---------------+---------+-----------+----------+--------------+ SFJ      Full                                                        +---------+---------------+---------+-----------+----------+--------------+ FV Prox  Full                                                         +---------+---------------+---------+-----------+----------+--------------+ FV Mid   Full                                                        +---------+---------------+---------+-----------+----------+--------------+ FV DistalFull                                                        +---------+---------------+---------+-----------+----------+--------------+   PFV      Full                                                        +---------+---------------+---------+-----------+----------+--------------+ POP      Full           Yes      Yes                                 +---------+---------------+---------+-----------+----------+--------------+ PTV      Full                                                        +---------+---------------+---------+-----------+----------+--------------+ PERO     Full                                                        +---------+---------------+---------+-----------+----------+--------------+     Summary: RIGHT: - There is no evidence of deep vein thrombosis in the lower extremity.  - No cystic structure found in the popliteal fossa.  LEFT: - There is no evidence of deep vein thrombosis in the lower extremity.  - No cystic structure found in the popliteal fossa.  *See table(s) above for measurements and observations.    Preliminary

## 2020-01-06 LAB — CBC
HCT: 40.6 % (ref 36.0–46.0)
Hemoglobin: 13.9 g/dL (ref 12.0–15.0)
MCH: 31.8 pg (ref 26.0–34.0)
MCHC: 34.2 g/dL (ref 30.0–36.0)
MCV: 92.9 fL (ref 80.0–100.0)
Platelets: 354 10*3/uL (ref 150–400)
RBC: 4.37 MIL/uL (ref 3.87–5.11)
RDW: 11.9 % (ref 11.5–15.5)
WBC: 14.1 10*3/uL — ABNORMAL HIGH (ref 4.0–10.5)
nRBC: 0 % (ref 0.0–0.2)

## 2020-01-06 LAB — COMPREHENSIVE METABOLIC PANEL
ALT: 26 U/L (ref 0–44)
AST: 27 U/L (ref 15–41)
Albumin: 3 g/dL — ABNORMAL LOW (ref 3.5–5.0)
Alkaline Phosphatase: 59 U/L (ref 38–126)
Anion gap: 10 (ref 5–15)
BUN: 7 mg/dL (ref 6–20)
CO2: 27 mmol/L (ref 22–32)
Calcium: 9.1 mg/dL (ref 8.9–10.3)
Chloride: 101 mmol/L (ref 98–111)
Creatinine, Ser: 0.58 mg/dL (ref 0.44–1.00)
GFR calc Af Amer: >60 mL/min (ref >60)
GFR calc non Af Amer: >60 mL/min (ref >60)
Glucose, Bld: 141 mg/dL — ABNORMAL HIGH (ref 70–99)
Potassium: 3.8 mmol/L (ref 3.5–5.1)
Sodium: 138 mmol/L (ref 135–145)
Total Bilirubin: 0.4 mg/dL (ref 0.3–1.2)
Total Protein: 7.5 g/dL (ref 6.5–8.1)

## 2020-01-06 LAB — PHOSPHORUS: Phosphorus: 2.3 mg/dL — ABNORMAL LOW (ref 2.5–4.6)

## 2020-01-06 LAB — D-DIMER, QUANTITATIVE: D-Dimer, Quant: 4.36 ug/mL-FEU — ABNORMAL HIGH (ref 0.00–0.50)

## 2020-01-06 LAB — GLUCOSE, CAPILLARY: Glucose-Capillary: 113 mg/dL — ABNORMAL HIGH (ref 70–99)

## 2020-01-06 LAB — FERRITIN: Ferritin: 94 ng/mL (ref 11–307)

## 2020-01-06 LAB — MAGNESIUM: Magnesium: 2 mg/dL (ref 1.7–2.4)

## 2020-01-06 LAB — HEPARIN LEVEL (UNFRACTIONATED): Heparin Unfractionated: 0.47 IU/mL (ref 0.30–0.70)

## 2020-01-06 LAB — C-REACTIVE PROTEIN: CRP: 10.9 mg/dL — ABNORMAL HIGH (ref <1.0)

## 2020-01-06 MED ORDER — APIXABAN 5 MG PO TABS
10.0000 mg | ORAL_TABLET | Freq: Two times a day (BID) | ORAL | Status: DC
  Administered 2020-01-06 – 2020-01-10 (×9): 10 mg via ORAL
  Filled 2020-01-06 (×9): qty 2

## 2020-01-06 MED ORDER — APIXABAN 5 MG PO TABS: 5.0000 mg | ORAL_TABLET | Freq: Two times a day (BID) | ORAL | Status: DC

## 2020-01-06 MED ORDER — AMLODIPINE BESYLATE 10 MG PO TABS
10.0000 mg | ORAL_TABLET | Freq: Every day | ORAL | Status: DC
  Administered 2020-01-07 – 2020-01-10 (×4): 10 mg via ORAL
  Filled 2020-01-06 (×4): qty 1

## 2020-01-06 NOTE — Progress Notes (Signed)
ANTICOAGULATION CONSULT NOTE - Follow Up Consult  Pharmacy Consult for heparin > apixaban Indication: pulmonary embolus  No Known Allergies  Patient Measurements: Height: 5\' 2"  (157.5 cm) Weight: 72.6 kg (160 lb) IBW/kg (Calculated) : 50.1   Vital Signs: Temp: 98 F (36.7 C) (09/18 0810) Temp Source: Oral (09/18 0810) BP: 157/103 (09/18 0956) Pulse Rate: 72 (09/18 0956)  Labs: Recent Labs     0000 01/03/20 2207 01/04/20 0028 01/04/20 1832 01/05/20 0039 01/05/20 0039 01/05/20 0130 01/05/20 0954 01/06/20 0140  HGB   < > 13.9  --   --  13.7   < >  --  14.1 13.9  HCT   < > 41.0  --   --  41.0  --   --  41.2 40.6  PLT   < > 317  --   --  303  --   --  327 354  HEPARINUNFRC  --   --   --    < >  --   --  0.34 0.30 0.47  CREATININE   < > 0.80  --   --  0.66  --   --  0.64 0.58  TROPONINIHS  --  5 5  --   --   --   --   --   --    < > = values in this interval not displayed.    Estimated Creatinine Clearance: 91.6 mL/min (by C-G formula based on SCr of 0.58 mg/dL).   Assessment: Ann Mcmillan presented to the ED with SOB and CP. She was recently found to be COVID positive. Found to have a bilateral PE and started on IV heparin. Hgb 13.9, PLT 354 today. Pt will be transitioned to apixaban today for treatment of PE.   Goal of Therapy:  Monitor platelets by anticoagulation protocol: Yes   Plan:  Discontinue heparin infusion today at 1200  Give apixaban 10mg  BID x7 days starting at 1200 on 9/18 then give apixaban 5mg  BID thereafter Continue to monitor CBC and s/sx bleeding    PGY1 Pharmacy Resident 01/06/2020,12:03 PM

## 2020-01-06 NOTE — Progress Notes (Signed)
PROGRESS NOTE                                                                                                                                                                                                             Patient Demographics:    Ann Mcmillan, is a 34 y.o. female, DOB - [birth year], VVO:[id number]  Outpatient Primary MD for the patient is Patient, No Pcp Per   Admit date - 01/03/2020   LOS - 2  Chief Complaint  Patient presents with   COVID       Brief Narrative: Patient is a 34 y.o. female with PMHx of bronchial asthma-tested positive for COVID-19 on 9/7-presented with several days history of shortness of breath, chest pain-found to have pulmonary embolism in the setting of COVID-19 pneumonia-subsequently admitted to the hospitalist service.  COVID-19 vaccinated status: Unvaccinated  Significant Events: 9/7>> COVID-19 positive 9/15>> presented to [facility] with pleuritic chest pain, shortness of breath-found to have PE.  Significant studies: 9/15>>Chest x-ray: No acute cardiopulmonary abnormality. 9/16>> CTA chest: Acute lobar/segmental pulmonary emboli on both sides. 9/17>> Echo: EF 65-60%, RV systolic function normal. 9/17>> bilateral lower extremity Doppler: No DVT  COVID-19 medications: Steroids: 9/16>> Remdesivir: 9/16>>  Antibiotics: None  Microbiology data: None  Procedures: None  Consults: None  DVT prophylaxis: Heparin infusion    Subjective:   Feeling slightly better-still with exertional dyspnea-less coughing spells-less reproducible chest pain today.   Assessment  & Plan :   Pulmonary embolism: Provoked by COVID-19-hemodynamically stable-on minimal oxygen requirement.  No DVT seen on lower extremity Doppler, no RV strain on echo.  On IV heparin-should be okay to transition to Eliquis today.  Continues to have pleuritic chest pain as well as chest wall tenderness due to  musculoskeletal pain likely from coughing-continue with supportive care.    COVID-19 pneumonia: On minimal amount of oxygen today-infiltrates seen on CT chest-CRP slowly downtrending-remains on steroids/remdesivir.   Fever: afebrile  O2 requirements:  SpO2: 100 % O2 Flow Rate (L/min): 2 L/min   COVID-19 Labs: Recent Labs    01/04/20 1146 01/05/20 0039 01/06/20 0140  DDIMER 4.50* 6.16* 4.36*  FERRITIN 56 79 94  LDH 131  --   --   CRP 11.9* 19.6* 10.9*    No results found for: BNP  Recent Labs  Lab 01/04/20 1146  PROCALCITON <0.10  No results found for: SARSCOV2NAA    Prone/Incentive Spirometry: encouraged  incentive spirometry use 3-4/hour.  Elevated BP without diagnosis of hypertension: BP remains elevated-increase amlodipine to 10 mg and follow.  Alcohol abuse: Claims she drinks 1 small bottle of Hennessy almost every day-claims that since she got sick-she is not had a drink-but not exactly sure when her last drink was.  Currently no signs of withdrawal-but will go and place on Ativan per CIWA protocol.  Watch closely.  Vent Settings: N/A  Condition - Stable  Family Communication  : Patient prefers to update family himself-I have asked her to let me know if family has questions.  Code Status :  Full Code  Diet :  Diet Order            Diet regular Room service appropriate? Yes; Fluid consistency: Thin  Diet effective now                  Disposition Plan  :   Status is: Inpatient  Remains inpatient appropriate because:Inpatient level of care appropriate due to severity of illness   Dispo: The patient is from: Home              Anticipated d/c is to: Home              Anticipated d/c date is: 2 days              Patient currently is not medically stable to d/c.    Barriers to discharge: COVID-19 pneumonia with PE-on IV anticoagulation and will need to complete 5 days of IV Remdesivir  Antimicorbials  :    Anti-infectives (From admission,  onward)   Start     Dose/Rate Route Frequency Ordered Stop   01/05/20 1000  remdesivir 100 mg in sodium chloride 0.9 % 100 mL IVPB  Status:  Discontinued       "Followed by" Linked Group Details   100 mg 200 mL/hr over 30 Minutes Intravenous Daily 01/04/20 1125 01/04/20 1135   01/05/20 1000  remdesivir 100 mg in sodium chloride 0.9 % 100 mL IVPB        100 mg 200 mL/hr over 30 Minutes Intravenous Daily 01/04/20 1136 01/09/20 0959   01/04/20 1200  remdesivir 100 mg in sodium chloride 0.9 % 100 mL IVPB        100 mg 200 mL/hr over 30 Minutes Intravenous Every 30 min 01/04/20 1136 01/04/20 1331   01/04/20 1130  remdesivir 200 mg in sodium chloride 0.9% 250 mL IVPB  Status:  Discontinued       "Followed by" Linked Group Details   200 mg 580 mL/hr over 30 Minutes Intravenous Once 01/04/20 1125 01/04/20 1135      Inpatient Medications  Scheduled Meds:  acetaminophen  1,000 mg Oral Q8H   amLODipine  5 mg Oral Daily   vitamin C  500 mg Oral Daily   benzonatate  200 mg Oral TID   folic acid  1 mg Oral Daily   methylPREDNISolone (SOLU-MEDROL) injection  0.5 mg/kg Intravenous Q12H   Followed by   Melene Muller[START ON 01/07/2020] predniSONE  50 mg Oral Daily   multivitamin with minerals  1 tablet Oral Daily   nicotine  14 mg Transdermal Daily   sodium chloride flush  3 mL Intravenous Q12H   thiamine  100 mg Oral Daily   Or   thiamine  100 mg Intravenous Daily   zinc sulfate  220 mg Oral Daily  Continuous Infusions:  sodium chloride     heparin 1,450 Units/hr (01/05/20 2222)   remdesivir 100 mg in NS 100 mL 100 mg (01/06/20 1001)   PRN Meds:.sodium chloride, albuterol, bisacodyl, chlorpheniramine-HYDROcodone, guaiFENesin-dextromethorphan, hydrALAZINE, LORazepam **OR** LORazepam, ondansetron **OR** ondansetron (ZOFRAN) IV, oxyCODONE, polyethylene glycol, sodium chloride flush, sodium phosphate   Time Spent in minutes  25 See all Orders from today for further details   Jeoffrey Massed M.D on 01/06/2020 at 11:37 AM  To page go to [email or web address] - use universal password  [facility] -  Office  [phone]    Objective:   Vitals:   01/05/20 1641 01/05/20 2053 01/06/20 0810 01/06/20 0956  BP: (!) 174/111 (!) 170/99 (!) 156/116 (!) 157/103  Pulse: 73 82 87 72  Resp: 20 16 20 20   Temp: 97.7 F (36.5 C) (!) 97.5 F (36.4 C) 98 F (36.7 C)   TempSrc: Oral Oral Oral   SpO2: 100% 100% 100%   Weight:      Height:        Wt Readings from Last 3 Encounters:  01/04/20 72.6 kg  07/28/16 68.3 kg  02/20/14 62.1 kg    No intake or output data in the 24 hours ending 01/06/20 1137   Physical Exam Gen Exam:Alert awake-not in any distress HEENT:atraumatic, normocephalic Chest: B/L clear to auscultation anteriorly CVS:S1S2 regular Abdomen:soft non tender, non distended Extremities:no edema Neurology: Non focal Skin: no rash   Data Review:    CBC Recent Labs  Lab 01/03/20 2207 01/05/20 0039 01/05/20 0954 01/06/20 0140  WBC 7.2 11.6* 13.5* 14.1*  HGB 13.9 13.7 14.1 13.9  HCT 41.0 41.0 41.2 40.6  PLT 317 303 327 354  MCV 91.7 91.9 93.0 92.9  MCH 31.1 30.7 31.8 31.8  MCHC 33.9 33.4 34.2 34.2  RDW 11.9 11.9 11.9 11.9    Chemistries  Recent Labs  Lab 01/03/20 2207 01/04/20 0837 01/05/20 0039 01/05/20 0954 01/06/20 0140  NA 134*  --  136 133* 138  K 2.9*  --  3.9 3.5 3.8  CL 98  --  102 100 101  CO2 24  --  24 25 27   GLUCOSE 128*  --  128* 181* 141*  BUN 6  --  7 6 7   CREATININE 0.80  --  0.66 0.64 0.58  CALCIUM 8.7*  --  9.0 9.0 9.1  MG  --  1.9 1.8 1.8 2.0  AST  --   --  27 27 27   ALT  --   --  22 25 26   ALKPHOS  --   --  62 61 59  BILITOT  --   --  0.6 0.5 0.4   ------------------------------------------------------------------------------------------------------------------ No results for input(s): CHOL, HDL, LDLCALC, TRIG, CHOLHDL, LDLDIRECT in the last 72 hours.  No results found for:  HGBA1C ------------------------------------------------------------------------------------------------------------------ No results for input(s): TSH, T4TOTAL, T3FREE, THYROIDAB in the last 72 hours.  Invalid input(s): FREET3 ------------------------------------------------------------------------------------------------------------------ Recent Labs    01/05/20 0039 01/06/20 0140  FERRITIN 79 94    Coagulation profile No results for input(s): INR, PROTIME in the last 168 hours.  Recent Labs    01/05/20 0039 01/06/20 0140  DDIMER 6.16* 4.36*    Cardiac Enzymes No results for input(s): CKMB, TROPONINI, MYOGLOBIN in the last 168 hours.  Invalid input(s): CK ------------------------------------------------------------------------------------------------------------------ No results found for: BNP  Micro Results No results found for this or any previous visit (from the past 240 hour(s)).  Radiology Reports CT Angio Chest PE W/Cm &/Or Wo Cm  Result Date: 01/04/2020 CLINICAL DATA:  High probability for pulmonary embolism EXAM: CT ANGIOGRAPHY CHEST WITH CONTRAST TECHNIQUE: Multidetector CT imaging of the chest was performed using the standard protocol during bolus administration of intravenous contrast. Multiplanar CT image reconstructions and MIPs were obtained to evaluate the vascular anatomy. CONTRAST:  75mL OMNIPAQUE IOHEXOL 350 MG/ML SOLN COMPARISON:  None. FINDINGS: Cardiovascular: Bilateral lobar pulmonary emboli, involving all lobes on the left and involving the lower lobe on the right. Segmental clot is seen at the right upper lobe anteriorly. Basilar segment infarct posteriorly on the left with small reactive appearing effusion. Negative for failure. Normal RV to LV ratio. No pericardial effusion. Negative aorta. Mediastinum/Nodes: Negative for adenopathy or mass Lungs/Pleura: Ground-glass opacity in a segmental distribution at the left base with trace effusion. Upper Abdomen:  Negative Musculoskeletal: Negative Review of the MIP images confirms the above findings. Critical Value/emergent results were called by telephone at the time of interpretation on 01/04/2020 at 10:41 am to provider [facility] , who verbally acknowledged these results. IMPRESSION: Acute lobar and segmental pulmonary emboli on both sides with occlusive clot and infarct in the left lower lobe. No right heart dilatation. Electronically Signed   By: Marnee Spring M.D.   On: 01/04/2020 10:43   DG Chest Portable 1 View  Result Date: 01/03/2020 CLINICAL DATA:  Shortness of breath, COVID-19 positive 12/30/2019 EXAM: PORTABLE CHEST 1 VIEW COMPARISON:  None. FINDINGS: No consolidation, features of edema, pneumothorax, or effusion. Pulmonary vascularity is normally distributed. The cardiomediastinal contours are unremarkable. No acute osseous or soft tissue abnormality. Small radiodensity projecting over the mid chest, possibly external to the patient. IMPRESSION: No acute cardiopulmonary abnormality. Tiny metallic rounded density projecting over midline chest, likely external, correlate with visual inspection. Electronically Signed   By: Kreg Shropshire M.D.   On: 01/03/2020 22:15   ECHOCARDIOGRAM COMPLETE  Result Date: 01/05/2020    ECHOCARDIOGRAM REPORT   Patient Name:   Ann Mcmillan Date of Exam: 01/05/2020 Medical Rec #:  [id number]   Height:       62.0 in Accession #:    [id number]  Weight:       160.0 lb Date of Birth:  [birth year]   BSA:          1.739 m Patient Age:    34 years    BP:           167/103 mmHg Patient Gender: F           HR:           86 bpm. Exam Location:  Inpatient Procedure: 2D Echo Indications:    pulmonary embolus 415.19  History:        Patient has no prior history of Echocardiogram examinations.                 Risk Factors:Current Smoker. Covid + . pulmonary emboli.  Sonographer:    Celene Skeen RDCS (AE) Referring Phys: [id number] Dewayne Shorter M Thedore Pickel IMPRESSIONS  1. Left ventricular ejection  fraction, by estimation, is 55 to 60%. The left ventricle has normal function. The left ventricle has no regional wall motion abnormalities. Left ventricular diastolic parameters were normal.  2. Right ventricular systolic function is normal. The right ventricular size is normal. Tricuspid regurgitation signal is inadequate for assessing PA pressure.  3. The mitral valve is grossly normal. Trivial mitral valve regurgitation. No evidence of mitral stenosis.  4. The aortic valve is tricuspid. Aortic valve regurgitation is not visualized. No aortic stenosis  is present.  5. The inferior vena cava is normal in size with greater than 50% respiratory variability, suggesting right atrial pressure of 3 mmHg. Conclusion(s)/Recommendation(s): Normal biventricular function without evidence of hemodynamically significant valvular heart disease. FINDINGS  Left Ventricle: Left ventricular ejection fraction, by estimation, is 55 to 60%. The left ventricle has normal function. The left ventricle has no regional wall motion abnormalities. The left ventricular internal cavity size was normal in size. There is  no left ventricular hypertrophy. Left ventricular diastolic parameters were normal. Right Ventricle: The right ventricular size is normal. No increase in right ventricular wall thickness. Right ventricular systolic function is normal. Tricuspid regurgitation signal is inadequate for assessing PA pressure. Left Atrium: Left atrial size was normal in size. Right Atrium: Right atrial size was normal in size. Pericardium: Trivial pericardial effusion is present. Mitral Valve: The mitral valve is grossly normal. Trivial mitral valve regurgitation. No evidence of mitral valve stenosis. Tricuspid Valve: The tricuspid valve is grossly normal. Tricuspid valve regurgitation is trivial. No evidence of tricuspid stenosis. Aortic Valve: The aortic valve is tricuspid. Aortic valve regurgitation is not visualized. No aortic stenosis is  present. Pulmonic Valve: The pulmonic valve was grossly normal. Pulmonic valve regurgitation is not visualized. No evidence of pulmonic stenosis. Aorta: The aortic root and ascending aorta are structurally normal, with no evidence of dilitation. Venous: The right upper pulmonary vein is normal. The inferior vena cava is normal in size with greater than 50% respiratory variability, suggesting right atrial pressure of 3 mmHg. IAS/Shunts: The atrial septum is grossly normal.  LEFT VENTRICLE PLAX 2D LVIDd:         5.33 cm  Diastology LVIDs:         3.91 cm  LV e' medial:    6.42 cm/s LV PW:         0.90 cm  LV E/e' medial:  12.6 LV IVS:        0.73 cm  LV e' lateral:   9.36 cm/s LVOT diam:     2.10 cm  LV E/e' lateral: 8.6 LV SV:         61 LV SV Index:   35 LVOT Area:     3.46 cm  RIGHT VENTRICLE RV S prime:     13.10 cm/s TAPSE (M-mode): 3.1 cm LEFT ATRIUM             Index       RIGHT ATRIUM           Index LA diam:        3.30 cm 1.90 cm/m  RA Area:     16.10 cm LA Vol (A2C):   35.6 ml 20.47 ml/m RA Volume:   41.20 ml  23.70 ml/m LA Vol (A4C):   22.0 ml 12.65 ml/m LA Biplane Vol: 28.6 ml 16.45 ml/m  AORTIC VALVE LVOT Vmax:   91.40 cm/s LVOT Vmean:  61.100 cm/s LVOT VTI:    0.177 m  AORTA Ao Root diam: 2.80 cm MITRAL VALVE MV Area (PHT): 2.56 cm    SHUNTS MV Decel Time: 296 msec    Systemic VTI:  0.18 m MV E velocity: 80.60 cm/s  Systemic Diam: 2.10 cm MV A velocity: 90.40 cm/s MV E/A ratio:  0.89 Lennie Odor MD Electronically signed by Lennie Odor MD Signature Date/Time: 01/05/2020/2:59:24 PM    Final    VAS [place] LOWER EXTREMITY VENOUS (DVT)  Result Date: 01/05/2020  Lower Venous DVTStudy Indications: Pain, Swelling, and + Covid.  Risk Factors: None identified. Performing Technologist: Jannet Askew RCT RDMS  Examination Guidelines: A complete evaluation includes B-mode imaging, spectral Doppler, color Doppler, and power Doppler as needed of all accessible portions of each vessel. Bilateral testing is  considered an integral part of a complete examination. Limited examinations for reoccurring indications may be performed as noted. The reflux portion of the exam is performed with the patient in reverse Trendelenburg.  +---------+---------------+---------+-----------+----------+--------------+  RIGHT     Compressibility Phasicity Spontaneity Properties Thrombus Aging  +---------+---------------+---------+-----------+----------+--------------+  CFV       Full            Yes       Yes                                    +---------+---------------+---------+-----------+----------+--------------+  SFJ       Full                                                             +---------+---------------+---------+-----------+----------+--------------+  FV Prox   Full                                                             +---------+---------------+---------+-----------+----------+--------------+  FV Mid    Full                                                             +---------+---------------+---------+-----------+----------+--------------+  FV Distal Full                                                             +---------+---------------+---------+-----------+----------+--------------+  PFV       Full                                                             +---------+---------------+---------+-----------+----------+--------------+  POP       Full            Yes       Yes                                    +---------+---------------+---------+-----------+----------+--------------+  PTV       Full                                                             +---------+---------------+---------+-----------+----------+--------------+  PERO      Full                                                             +---------+---------------+---------+-----------+----------+--------------+   +---------+---------------+---------+-----------+----------+--------------+  LEFT       Compressibility Phasicity Spontaneity Properties Thrombus Aging  +---------+---------------+---------+-----------+----------+--------------+  CFV       Full            Yes       Yes                                    +---------+---------------+---------+-----------+----------+--------------+  SFJ       Full                                                             +---------+---------------+---------+-----------+----------+--------------+  FV Prox   Full                                                             +---------+---------------+---------+-----------+----------+--------------+  FV Mid    Full                                                             +---------+---------------+---------+-----------+----------+--------------+  FV Distal Full                                                             +---------+---------------+---------+-----------+----------+--------------+  PFV       Full                                                             +---------+---------------+---------+-----------+----------+--------------+  POP       Full            Yes       Yes                                    +---------+---------------+---------+-----------+----------+--------------+  PTV       Full                                                             +---------+---------------+---------+-----------+----------+--------------+  PERO      Full                                                             +---------+---------------+---------+-----------+----------+--------------+     Summary: RIGHT: - There is no evidence of deep vein thrombosis in the lower extremity.  - No cystic structure found in the popliteal fossa.  LEFT: - There is no evidence of deep vein thrombosis in the lower extremity.  - No cystic structure found in the popliteal fossa.  *See table(s) above for measurements and observations. Electronically signed by Lemar Livings MD on 01/05/2020 at 12:04:19 PM.    Final

## 2020-01-06 NOTE — Progress Notes (Addendum)
ANTICOAGULATION CONSULT NOTE  Pharmacy Consult for heparin Indication: pulmonary embolus  No Known Allergies  Patient Measurements: Height: 5\' 2"  (157.5 cm) Weight: 72.6 kg (160 lb) IBW/kg (Calculated) : 50.1 Heparin Dosing Weight: 65.6kg  Vital Signs: Temp: 98 F (36.7 C) (09/18 0810) Temp Source: Oral (09/18 0810) BP: 156/116 (09/18 0810) Pulse Rate: 87 (09/18 0810)  Labs: Recent Labs     0000 01/03/20 2207 01/04/20 0028 01/04/20 1832 01/05/20 0039 01/05/20 0039 01/05/20 0130 01/05/20 0954 01/06/20 0140  HGB   < > 13.9  --   --  13.7   < >  --  14.1 13.9  HCT   < > 41.0  --   --  41.0  --   --  41.2 40.6  PLT   < > 317  --   --  303  --   --  327 354  HEPARINUNFRC  --   --   --    < >  --   --  0.34 0.30 0.47  CREATININE   < > 0.80  --   --  0.66  --   --  0.64 0.58  TROPONINIHS  --  5 5  --   --   --   --   --   --    < > = values in this interval not displayed.    Estimated Creatinine Clearance: 91.6 mL/min (by C-G formula based on SCr of 0.58 mg/dL).   Assessment: Ann Mcmillan presented to the ED with SOB and CP. She was recently found to be COVID positive. Found to have a bilateral PE and started on IV heparin. Hgb 13.9, PLT 354 today  Heparin level is therapeutic (0.47); no bleeding reported.  Goal of Therapy:  Heparin level 0.3-0.7 units/ml Monitor platelets by anticoagulation protocol: Yes   Plan:  Continue heparin drip at 1450 units/hr Monitor daily heparin level and CBC and s/sx of bleeding   31, PharmD PGY1 Pharmacy Resident 01/06/2020 Ann:35 AM

## 2020-01-07 LAB — D-DIMER, QUANTITATIVE: D-Dimer, Quant: 1.66 ug/mL-FEU — ABNORMAL HIGH (ref 0.00–0.50)

## 2020-01-07 LAB — COMPREHENSIVE METABOLIC PANEL
ALT: 29 U/L (ref 0–44)
AST: 38 U/L (ref 15–41)
Albumin: 2.9 g/dL — ABNORMAL LOW (ref 3.5–5.0)
Alkaline Phosphatase: 60 U/L (ref 38–126)
Anion gap: 8 (ref 5–15)
BUN: 8 mg/dL (ref 6–20)
CO2: 29 mmol/L (ref 22–32)
Calcium: 9 mg/dL (ref 8.9–10.3)
Chloride: 101 mmol/L (ref 98–111)
Creatinine, Ser: 0.82 mg/dL (ref 0.44–1.00)
GFR calc Af Amer: >60 mL/min (ref >60)
GFR calc non Af Amer: >60 mL/min (ref >60)
Glucose, Bld: 153 mg/dL — ABNORMAL HIGH (ref 70–99)
Potassium: 3.4 mmol/L — ABNORMAL LOW (ref 3.5–5.1)
Sodium: 138 mmol/L (ref 135–145)
Total Bilirubin: 0.4 mg/dL (ref 0.3–1.2)
Total Protein: 7.4 g/dL (ref 6.5–8.1)

## 2020-01-07 LAB — CBC
HCT: 42.5 % (ref 36.0–46.0)
Hemoglobin: 14.3 g/dL (ref 12.0–15.0)
MCH: 31.5 pg (ref 26.0–34.0)
MCHC: 33.6 g/dL (ref 30.0–36.0)
MCV: 93.6 fL (ref 80.0–100.0)
Platelets: 363 10*3/uL (ref 150–400)
RBC: 4.54 MIL/uL (ref 3.87–5.11)
RDW: 12.1 % (ref 11.5–15.5)
WBC: 10.3 10*3/uL (ref 4.0–10.5)
nRBC: 0 % (ref 0.0–0.2)

## 2020-01-07 LAB — GLUCOSE, CAPILLARY
Glucose-Capillary: 100 mg/dL — ABNORMAL HIGH (ref 70–99)
Glucose-Capillary: 117 mg/dL — ABNORMAL HIGH (ref 70–99)
Glucose-Capillary: 95 mg/dL (ref 70–99)
Glucose-Capillary: 90 mg/dL (ref 70–99)

## 2020-01-07 LAB — PHOSPHORUS: Phosphorus: 2.4 mg/dL — ABNORMAL LOW (ref 2.5–4.6)

## 2020-01-07 LAB — C-REACTIVE PROTEIN: CRP: 3.8 mg/dL — ABNORMAL HIGH (ref <1.0)

## 2020-01-07 LAB — FERRITIN: Ferritin: 75 ng/mL (ref 11–307)

## 2020-01-07 LAB — MAGNESIUM: Magnesium: 1.9 mg/dL (ref 1.7–2.4)

## 2020-01-07 MED ORDER — MAGNESIUM SULFATE 2 GM/50ML IV SOLN
2.0000 g | Freq: Once | INTRAVENOUS | Status: AC
  Administered 2020-01-07: 2 g via INTRAVENOUS
  Filled 2020-01-07: qty 50

## 2020-01-07 MED ORDER — POTASSIUM CHLORIDE CRYS ER 20 MEQ PO TBCR
40.0000 meq | EXTENDED_RELEASE_TABLET | Freq: Once | ORAL | Status: AC
  Administered 2020-01-07: 40 meq via ORAL
  Filled 2020-01-07: qty 2

## 2020-01-07 NOTE — Progress Notes (Signed)
Per RN no additional IV access needed. Current IV with no signs of infiltration; pt denies pain

## 2020-01-07 NOTE — Progress Notes (Signed)
PROGRESS NOTE                                                                                                                                                                                                             Patient Demographics:    Ann Mcmillan, is a 34 y.o. female, DOB - Jul 28, 1985, YQI:[id number]  Outpatient Primary MD for the patient is Patient, No Pcp Per   Admit date - 01/03/2020   LOS - 3  Chief Complaint  Patient presents with  . COVID       Brief Narrative: Patient is a 34 y.o. female with PMHx of bronchial asthma-tested positive for COVID-19 on 9/7-presented with several days history of shortness of breath, chest pain-found to have pulmonary embolism in the setting of COVID-19 pneumonia-subsequently admitted to the hospitalist service.  COVID-19 vaccinated status: Unvaccinated  Significant Events: 9/7>> COVID-19 positive 9/15>> presented to [facility] with pleuritic chest pain, shortness of breath-found to have PE.  Significant studies: 9/15>>Chest x-ray: No acute cardiopulmonary abnormality. 9/16>> CTA chest: Acute lobar/segmental pulmonary emboli on both sides. 9/17>> Echo: EF 65-60%, RV systolic function normal. 9/17>> bilateral lower extremity Doppler: No DVT  COVID-19 medications: Steroids: 9/16>> Remdesivir: 9/16>>  Antibiotics: None  Microbiology data: None  Procedures: None  Consults: None  DVT prophylaxis: Heparin infusion>> Eliquis    Subjective:   Feels better-Per nursing staff-ambulating in the room without any shortness of breath.  Patient acknowledges improvement in shortness of breath with exertion-and less pain in her chest compared with the past few days.   Assessment  & Plan :   Pulmonary embolism: Provoked by COVID-19-hemodynamically stable-on room air today-no DVT on lower extremity Doppler-no RV strain on echo.  Initially on IV heparin-has been transitioned to  Eliquis.  Continues to have some amount of pleuritic chest pain-as well as some amount of musculoskeletal chest pain due to coughing.  She appears significantly much more comfortable today than the past few days-acknowledges less shortness of breath with ambulation-and less pain in the chest compared to the past few days.  COVID-19 pneumonia: On minimal amount of oxygen today-infiltrates seen on CT chest-CRP slowly downtrending-remains on steroids/remdesivir.   Fever: afebrile  O2 requirements:  SpO2: 100 % O2 Flow Rate (L/min): 2 L/min   COVID-19 Labs: Recent Labs    01/05/20 0039 01/06/20 0140 01/07/20 1023  DDIMER 6.16* 4.36* 1.66*  FERRITIN 79 94 75  CRP 19.6* 10.9* 3.8*    No results found for: BNP  Recent Labs  Lab 01/04/20 1146  PROCALCITON <0.10    No results found for: SARSCOV2NAA    Prone/Incentive Spirometry: encouraged  incentive spirometry use 3-4/hour.  Elevated BP without diagnosis of hypertension: BP remains elevated-have increased amlodipine to 10 mg on 9/18-reassess tomorrow-before adjusting any further.    Alcohol abuse: Claims she drinks 1 small bottle of Hennessy almost every day-claims that since she got sick-she is not had a drink-but not exactly sure when her last drink was.  Currently no signs of withdrawal-but will go and place on Ativan per [facility] protocol.  Watch closely.  Vent Settings: N/A  Condition - Stable  Family Communication  : Patient prefers to update family himself-I have asked her to let me know if family has questions.  Code Status :  Full Code  Diet :  Diet Order            Diet regular Room service appropriate? Yes; Fluid consistency: Thin  Diet effective now                  Disposition Plan  :   Status is: Inpatient  Remains inpatient appropriate because:Inpatient level of care appropriate due to severity of illness   Dispo: The patient is from: Home              Anticipated d/c is to: Home               Anticipated d/c date is: 2 days              Patient currently is not medically stable to d/c.    Barriers to discharge: COVID-19 pneumonia with PE-on IV anticoagulation and will need to complete 5 days of IV Remdesivir  Antimicorbials  :    Anti-infectives (From admission, onward)   Start     Dose/Rate Route Frequency Ordered Stop   01/05/20 1000  remdesivir 100 mg in sodium chloride 0.9 % 100 mL IVPB  Status:  Discontinued       "Followed by" Linked Group Details   100 mg 200 mL/hr over 30 Minutes Intravenous Daily 01/04/20 1125 01/04/20 1135   01/05/20 1000  remdesivir 100 mg in sodium chloride 0.9 % 100 mL IVPB        100 mg 200 mL/hr over 30 Minutes Intravenous Daily 01/04/20 1136 01/09/20 0959   01/04/20 1200  remdesivir 100 mg in sodium chloride 0.9 % 100 mL IVPB        100 mg 200 mL/hr over 30 Minutes Intravenous Every 30 min 01/04/20 1136 01/04/20 1331   01/04/20 1130  remdesivir 200 mg in sodium chloride 0.9% 250 mL IVPB  Status:  Discontinued       "Followed by" Linked Group Details   200 mg 580 mL/hr over 30 Minutes Intravenous Once 01/04/20 1125 01/04/20 1135      Inpatient Medications  Scheduled Meds: . acetaminophen  1,000 mg Oral Q8H  . amLODipine  10 mg Oral Daily  . apixaban  10 mg Oral BID   Followed by  . [START ON 01/13/2020] apixaban  5 mg Oral BID  . vitamin C  500 mg Oral Daily  . benzonatate  200 mg Oral TID  . folic acid  1 mg Oral Daily  . multivitamin with minerals  1 tablet Oral Daily  . nicotine  14 mg Transdermal Daily  .  predniSONE  50 mg Oral Daily  . sodium chloride flush  3 mL Intravenous Q12H  . thiamine  100 mg Oral Daily   Or  . thiamine  100 mg Intravenous Daily  . zinc sulfate  220 mg Oral Daily   Continuous Infusions: . sodium chloride    . remdesivir 100 mg in NS 100 mL 100 mg (01/07/20 1056)   PRN Meds:.sodium chloride, albuterol, bisacodyl, chlorpheniramine-HYDROcodone, guaiFENesin-dextromethorphan, hydrALAZINE, LORazepam  **OR** LORazepam, ondansetron **OR** ondansetron (ZOFRAN) IV, oxyCODONE, polyethylene glycol, sodium chloride flush, sodium phosphate   Time Spent in minutes  25 See all Orders from today for further details   Jeoffrey Massed M.D on 01/07/2020 at 1:03 PM  To page go to [email or web address] - use universal password  [facility] -  Office  [phone]    Objective:   Vitals:   01/06/20 0956 01/06/20 1639 01/06/20 2319 01/07/20 0737  BP: (!) 157/103 (!) 165/109 (!) 170/98 (!) 173/111  Pulse: 72 73 70 78  Resp: Temp:  97.8 F (36.6 C) (!) 97.1 F (36.2 C) 97.9 F (36.6 C)  TempSrc:  Oral Oral   SpO2:  100%  100%  Weight:      Height:        Wt Readings from Last 3 Encounters:  01/04/20 72.6 kg  07/28/16 68.3 kg  02/20/14 62.1 kg     Intake/Output Summary (Last 24 hours) at 01/07/2020 1303 Last data filed at 01/06/2020 1640 Gross per 24 hour  Intake --  Output 500 ml  Net -500 ml     Physical Exam Gen Exam:Alert awake-not in any distress HEENT:atraumatic, normocephalic Chest: B/L clear to auscultation anteriorly CVS:S1S2 regular Abdomen:soft non tender, non distended Extremities:no edema Neurology: Non focal Skin: no rash   Data Review:    CBC Recent Labs  Lab 01/03/20 2207 01/05/20 0039 01/05/20 0954 01/06/20 0140 01/07/20 1023  WBC 7.2 11.6* 13.5* 14.1* 10.3  HGB 13.9 13.7 14.1 13.9 14.3  HCT 41.0 41.0 41.2 40.6 42.5  PLT 317 303 327 354 363  MCV 91.7 91.9 93.0 92.9 93.6  MCH 31.1 30.7 31.8 31.8 31.5  MCHC 33.9 33.4 34.2 34.2 33.6  RDW 11.9 11.9 11.9 11.9 12.1    Chemistries  Recent Labs  Lab 01/03/20 2207 01/04/20 0837 01/05/20 0039 01/05/20 0954 01/06/20 0140 01/07/20 1023  NA 134*  --  136 133* 138 138  K 2.9*  --  3.9 3.5 3.8 3.4*  CL 98  --  102 100 101 101  CO2 24  --  GLUCOSE 128*  --  128* 181* 141* 153*  BUN 6  --  CREATININE 0.80  --  0.66 0.64 0.58 0.82  CALCIUM 8.7*  --  9.0 9.0 9.1  9.0  MG  --  1.9 1.8 1.8 2.0 1.9  AST  --   --  38  ALT  --   --  ALKPHOS  --   --  62 61 59 60  BILITOT  --   --  0.6 0.5 0.4 0.4   ------------------------------------------------------------------------------------------------------------------ No results for input(s): CHOL, HDL, LDLCALC, TRIG, CHOLHDL, LDLDIRECT in the last 72 hours.  No results found for: HGBA1C ------------------------------------------------------------------------------------------------------------------ No results for input(s): TSH, T4TOTAL, T3FREE, THYROIDAB in the last 72 hours.  Invalid input(s): FREET3 ------------------------------------------------------------------------------------------------------------------ Recent Labs    01/06/20 0140 01/07/20 1023  FERRITIN 94 75  Coagulation profile No results for input(s): INR, PROTIME in the last 168 hours.  Recent Labs    01/06/20 0140 01/07/20 1023  DDIMER 4.36* 1.66*    Cardiac Enzymes No results for input(s): CKMB, TROPONINI, MYOGLOBIN in the last 168 hours.  Invalid input(s): CK ------------------------------------------------------------------------------------------------------------------ No results found for: BNP  Micro Results No results found for this or any previous visit (from the past 240 hour(s)).  Radiology Reports CT Angio Chest PE W/Cm &/Or Wo Cm  Result Date: 01/04/2020 CLINICAL DATA:  High probability for pulmonary embolism EXAM: CT ANGIOGRAPHY CHEST WITH CONTRAST TECHNIQUE: Multidetector CT imaging of the chest was performed using the standard protocol during bolus administration of intravenous contrast. Multiplanar CT image reconstructions and MIPs were obtained to evaluate the vascular anatomy. CONTRAST:  75mL OMNIPAQUE IOHEXOL 350 MG/ML SOLN COMPARISON:  None. FINDINGS: Cardiovascular: Bilateral lobar pulmonary emboli, involving all lobes on the left and involving the lower lobe on the right.  Segmental clot is seen at the right upper lobe anteriorly. Basilar segment infarct posteriorly on the left with small reactive appearing effusion. Negative for failure. Normal RV to LV ratio. No pericardial effusion. Negative aorta. Mediastinum/Nodes: Negative for adenopathy or mass Lungs/Pleura: Ground-glass opacity in a segmental distribution at the left base with trace effusion. Upper Abdomen: Negative Musculoskeletal: Negative Review of the MIP images confirms the above findings. Critical Value/emergent results were called by telephone at the time of interpretation on 01/04/2020 at 10:41 am to provider [facility] , who verbally acknowledged these results. IMPRESSION: Acute lobar and segmental pulmonary emboli on both sides with occlusive clot and infarct in the left lower lobe. No right heart dilatation. Electronically Signed   By: Marnee Spring M.D.   On: 01/04/2020 10:43   DG Chest Portable 1 View  Result Date: 01/03/2020 CLINICAL DATA:  Shortness of breath, COVID-19 positive 12/30/2019 EXAM: PORTABLE CHEST 1 VIEW COMPARISON:  None. FINDINGS: No consolidation, features of edema, pneumothorax, or effusion. Pulmonary vascularity is normally distributed. The cardiomediastinal contours are unremarkable. No acute osseous or soft tissue abnormality. Small radiodensity projecting over the mid chest, possibly external to the patient. IMPRESSION: No acute cardiopulmonary abnormality. Tiny metallic rounded density projecting over midline chest, likely external, correlate with visual inspection. Electronically Signed   By: Kreg Shropshire M.D.   On: 01/03/2020 22:15   ECHOCARDIOGRAM COMPLETE  Result Date: 01/05/2020    ECHOCARDIOGRAM REPORT   Patient Name:   Ann Mcmillan Date of Exam: 01/05/2020 Medical Rec #:  [id number]   Height:       62.0 in Accession #:    [id number]  Weight:       160.0 lb Date of Birth:  06-15-1985   BSA:          1.739 m Patient Age:    34 years    BP:           167/103 mmHg Patient Gender:  F           HR:           86 bpm. Exam Location:  Inpatient Procedure: 2D Echo Indications:    pulmonary embolus 415.19  History:        Patient has no prior history of Echocardiogram examinations.                 Risk Factors:Current Smoker. Covid + . pulmonary emboli.  Sonographer:    Celene Skeen RDCS (AE) Referring Phys: [id number] Dewayne Shorter M Lyden Redner IMPRESSIONS  1. Left ventricular ejection fraction,  by estimation, is 55 to 60%. The left ventricle has normal function. The left ventricle has no regional wall motion abnormalities. Left ventricular diastolic parameters were normal.  2. Right ventricular systolic function is normal. The right ventricular size is normal. Tricuspid regurgitation signal is inadequate for assessing PA pressure.  3. The mitral valve is grossly normal. Trivial mitral valve regurgitation. No evidence of mitral stenosis.  4. The aortic valve is tricuspid. Aortic valve regurgitation is not visualized. No aortic stenosis is present.  5. The inferior vena cava is normal in size with greater than 50% respiratory variability, suggesting right atrial pressure of 3 mmHg. Conclusion(s)/Recommendation(s): Normal biventricular function without evidence of hemodynamically significant valvular heart disease. FINDINGS  Left Ventricle: Left ventricular ejection fraction, by estimation, is 55 to 60%. The left ventricle has normal function. The left ventricle has no regional wall motion abnormalities. The left ventricular internal cavity size was normal in size. There is  no left ventricular hypertrophy. Left ventricular diastolic parameters were normal. Right Ventricle: The right ventricular size is normal. No increase in right ventricular wall thickness. Right ventricular systolic function is normal. Tricuspid regurgitation signal is inadequate for assessing PA pressure. Left Atrium: Left atrial size was normal in size. Right Atrium: Right atrial size was normal in size. Pericardium: Trivial pericardial  effusion is present. Mitral Valve: The mitral valve is grossly normal. Trivial mitral valve regurgitation. No evidence of mitral valve stenosis. Tricuspid Valve: The tricuspid valve is grossly normal. Tricuspid valve regurgitation is trivial. No evidence of tricuspid stenosis. Aortic Valve: The aortic valve is tricuspid. Aortic valve regurgitation is not visualized. No aortic stenosis is present. Pulmonic Valve: The pulmonic valve was grossly normal. Pulmonic valve regurgitation is not visualized. No evidence of pulmonic stenosis. Aorta: The aortic root and ascending aorta are structurally normal, with no evidence of dilitation. Venous: The right upper pulmonary vein is normal. The inferior vena cava is normal in size with greater than 50% respiratory variability, suggesting right atrial pressure of 3 mmHg. IAS/Shunts: The atrial septum is grossly normal.  LEFT VENTRICLE PLAX 2D LVIDd:         5.33 cm  Diastology LVIDs:         3.91 cm  LV e' medial:    6.42 cm/s LV PW:         0.90 cm  LV E/e' medial:  12.6 LV IVS:        0.73 cm  LV e' lateral:   9.36 cm/s LVOT diam:     2.10 cm  LV E/e' lateral: 8.6 LV SV:         61 LV SV Index:   35 LVOT Area:     3.46 cm  RIGHT VENTRICLE RV S prime:     13.10 cm/s TAPSE (M-mode): 3.1 cm LEFT ATRIUM             Index       RIGHT ATRIUM           Index LA diam:        3.30 cm 1.90 cm/m  RA Area:     16.10 cm LA Vol (A2C):   35.6 ml 20.47 ml/m RA Volume:   41.20 ml  23.70 ml/m LA Vol (A4C):   22.0 ml 12.65 ml/m LA Biplane Vol: 28.6 ml 16.45 ml/m  AORTIC VALVE LVOT Vmax:   91.40 cm/s LVOT Vmean:  61.100 cm/s LVOT VTI:    0.177 m  AORTA Ao Root diam: 2.80 cm MITRAL VALVE  MV Area (PHT): 2.56 cm    SHUNTS MV Decel Time: 296 msec    Systemic VTI:  0.18 m MV E velocity: 80.60 cm/s  Systemic Diam: 2.10 cm MV A velocity: 90.40 cm/s MV E/A ratio:  0.89 Lennie Odor MD Electronically signed by Lennie Odor MD Signature Date/Time: 01/05/2020/2:59:24 PM    Final    VAS [place] LOWER  EXTREMITY VENOUS (DVT)  Result Date: 01/05/2020  Lower Venous DVTStudy Indications: Pain, Swelling, and + Covid.  Risk Factors: None identified. Performing Technologist: Jannet Askew RCT RDMS  Examination Guidelines: A complete evaluation includes B-mode imaging, spectral Doppler, color Doppler, and power Doppler as needed of all accessible portions of each vessel. Bilateral testing is considered an integral part of a complete examination. Limited examinations for reoccurring indications may be performed as noted. The reflux portion of the exam is performed with the patient in reverse Trendelenburg.  +---------+---------------+---------+-----------+----------+--------------+ RIGHT    CompressibilityPhasicitySpontaneityPropertiesThrombus Aging +---------+---------------+---------+-----------+----------+--------------+ CFV      Full           Yes      Yes                                 +---------+---------------+---------+-----------+----------+--------------+ SFJ      Full                                                        +---------+---------------+---------+-----------+----------+--------------+ FV Prox  Full                                                        +---------+---------------+---------+-----------+----------+--------------+ FV Mid   Full                                                        +---------+---------------+---------+-----------+----------+--------------+ FV DistalFull                                                        +---------+---------------+---------+-----------+----------+--------------+ PFV      Full                                                        +---------+---------------+---------+-----------+----------+--------------+ POP      Full           Yes      Yes                                 +---------+---------------+---------+-----------+----------+--------------+ PTV      Full                                                         +---------+---------------+---------+-----------+----------+--------------+  PERO     Full                                                        +---------+---------------+---------+-----------+----------+--------------+   +---------+---------------+---------+-----------+----------+--------------+ LEFT     CompressibilityPhasicitySpontaneityPropertiesThrombus Aging +---------+---------------+---------+-----------+----------+--------------+ CFV      Full           Yes      Yes                                 +---------+---------------+---------+-----------+----------+--------------+ SFJ      Full                                                        +---------+---------------+---------+-----------+----------+--------------+ FV Prox  Full                                                        +---------+---------------+---------+-----------+----------+--------------+ FV Mid   Full                                                        +---------+---------------+---------+-----------+----------+--------------+ FV DistalFull                                                        +---------+---------------+---------+-----------+----------+--------------+ PFV      Full                                                        +---------+---------------+---------+-----------+----------+--------------+ POP      Full           Yes      Yes                                 +---------+---------------+---------+-----------+----------+--------------+ PTV      Full                                                        +---------+---------------+---------+-----------+----------+--------------+ PERO     Full                                                        +---------+---------------+---------+-----------+----------+--------------+  Summary: RIGHT: - There is no evidence of deep vein thrombosis in the lower extremity.   - No cystic structure found in the popliteal fossa.  LEFT: - There is no evidence of deep vein thrombosis in the lower extremity.  - No cystic structure found in the popliteal fossa.  *See table(s) above for measurements and observations. Electronically signed by Lemar Livings MD on 01/05/2020 at 12:04:19 PM.    Final

## 2020-01-07 NOTE — Social Work (Signed)
CSW consulted by RN that patient requested to speak with CSW. CSW contacted patient by phone. Patient expressed she is in need of assistance with medications, considering she does not have insurance. CSW agreed to inform case manager to see what assistance can be provided before discharge. Patient expressed she is expected to discharge Monday and she does not think she is ready. CSW expressed understanding and informed patient to speak with the MD about her concerns. Patient stated she will speak with the MD about it.  Manfred Arch, LCSWA Clinical Social Worker

## 2020-01-08 DIAGNOSIS — R079 Chest pain, unspecified: Secondary | ICD-10-CM

## 2020-01-08 LAB — CBC
HCT: 42.2 % (ref 36.0–46.0)
Hemoglobin: 14.3 g/dL (ref 12.0–15.0)
MCH: 31.7 pg (ref 26.0–34.0)
MCHC: 33.9 g/dL (ref 30.0–36.0)
MCV: 93.6 fL (ref 80.0–100.0)
Platelets: 378 10*3/uL (ref 150–400)
RBC: 4.51 MIL/uL (ref 3.87–5.11)
RDW: 12.1 % (ref 11.5–15.5)
WBC: 13.2 10*3/uL — ABNORMAL HIGH (ref 4.0–10.5)
nRBC: 0 % (ref 0.0–0.2)

## 2020-01-08 LAB — COMPREHENSIVE METABOLIC PANEL
ALT: 76 U/L — ABNORMAL HIGH (ref 0–44)
AST: 98 U/L — ABNORMAL HIGH (ref 15–41)
Albumin: 3 g/dL — ABNORMAL LOW (ref 3.5–5.0)
Alkaline Phosphatase: 66 U/L (ref 38–126)
Anion gap: 12 (ref 5–15)
BUN: 6 mg/dL (ref 6–20)
CO2: 24 mmol/L (ref 22–32)
Calcium: 9 mg/dL (ref 8.9–10.3)
Chloride: 100 mmol/L (ref 98–111)
Creatinine, Ser: 0.64 mg/dL (ref 0.44–1.00)
GFR calc Af Amer: >60 mL/min (ref >60)
GFR calc non Af Amer: >60 mL/min (ref >60)
Glucose, Bld: 140 mg/dL — ABNORMAL HIGH (ref 70–99)
Potassium: 4.4 mmol/L (ref 3.5–5.1)
Sodium: 136 mmol/L (ref 135–145)
Total Bilirubin: 0.4 mg/dL (ref 0.3–1.2)
Total Protein: 7.3 g/dL (ref 6.5–8.1)

## 2020-01-08 LAB — D-DIMER, QUANTITATIVE: D-Dimer, Quant: 1.41 ug/mL-FEU — ABNORMAL HIGH (ref 0.00–0.50)

## 2020-01-08 LAB — GLUCOSE, CAPILLARY
Glucose-Capillary: 86 mg/dL (ref 70–99)
Glucose-Capillary: 131 mg/dL — ABNORMAL HIGH (ref 70–99)

## 2020-01-08 LAB — FERRITIN: Ferritin: 102 ng/mL (ref 11–307)

## 2020-01-08 LAB — C-REACTIVE PROTEIN: CRP: 3.4 mg/dL — ABNORMAL HIGH (ref <1.0)

## 2020-01-08 LAB — MAGNESIUM: Magnesium: 2 mg/dL (ref 1.7–2.4)

## 2020-01-08 MED ORDER — CYCLOBENZAPRINE HCL 10 MG PO TABS
5.0000 mg | ORAL_TABLET | Freq: Three times a day (TID) | ORAL | Status: DC | PRN
  Administered 2020-01-08 – 2020-01-10 (×4): 5 mg via ORAL
  Filled 2020-01-08 (×4): qty 1

## 2020-01-08 MED ORDER — OXYCODONE HCL 5 MG PO TABS
10.0000 mg | ORAL_TABLET | ORAL | Status: DC | PRN
  Administered 2020-01-08 – 2020-01-10 (×8): 10 mg via ORAL
  Filled 2020-01-08 (×8): qty 2

## 2020-01-08 MED ORDER — LIDOCAINE 5 % EX PTCH
1.0000 | MEDICATED_PATCH | Freq: Every day | CUTANEOUS | Status: DC
  Administered 2020-01-08 – 2020-01-09 (×2): 1 via TRANSDERMAL
  Filled 2020-01-08 (×2): qty 1

## 2020-01-08 NOTE — Plan of Care (Signed)

## 2020-01-08 NOTE — Progress Notes (Signed)
PROGRESS NOTE                                                                                                                                                                                                             Patient Demographics:    Ann Mcmillan, is a 34 y.o. female, DOB - 12/25/1985, ZOX:[id number]  Outpatient Primary MD for the patient is Patient, No Pcp Per   Admit date - 01/03/2020   LOS - 4  Chief Complaint  Patient presents with  . COVID       Brief Narrative: Patient is a 34 y.o. female with PMHx of bronchial asthma-tested positive for COVID-19 on 9/7-presented with several days history of shortness of breath, chest pain-found to have pulmonary embolism in the setting of COVID-19 pneumonia-subsequently admitted to the hospitalist service.  COVID-19 vaccinated status: Unvaccinated  Significant Events: 9/7>> COVID-19 positive 9/15>> presented to [facility] with pleuritic chest pain, shortness of breath-found to have PE.  Significant studies: 9/15>>Chest x-ray: No acute cardiopulmonary abnormality. 9/16>> CTA chest: Acute lobar/segmental pulmonary emboli on both sides. 9/17>> Echo: EF 65-60%, RV systolic function normal. 9/17>> bilateral lower extremity Doppler: No DVT  COVID-19 medications: Steroids: 9/16>> Remdesivir: 9/16>> 9/20  Antibiotics: None  Microbiology data: None  Procedures: None  Consults: None  DVT prophylaxis: Heparin infusion>> Eliquis    Subjective:   Concerned that she is still having chest pain-inquiring whether the blood clot has dissolved.  Chest pain remains pleuritic and musculoskeletal (easily reproducible with gentle palpation)   Assessment  & Plan :   Pulmonary embolism: Provoked by COVID-19-on room air-hemodynamically stable-no RV strain on echo, no DVT seen in lower extremity Doppler.  Initially on IV heparin-has been transitioned to Eliquis.  Patient concerned  that she continues to have chest pain-concern as to if her blood clots have dissolved.  Have explained that she will likely have chest pain for the next several days/weeks-and that it is most likely from PE and musculoskeletal etiology from repeated coughing.  Increase as needed oxycodone to 10 mg-add Lidoderm patch-continue scheduled Tylenol.  Will reassess tomorrow  COVID-19 pneumonia: On room air-we will complete Remdesivir on 9/20-start tapering steroids.  CRP downtrending.     Fever: afebrile  O2 requirements:  SpO2: 99 % O2 Flow Rate (L/min): 3 L/min   COVID-19 Labs: Recent Labs  01/06/20 0140 01/07/20 1023 01/08/20 1415  DDIMER 4.36* 1.66* 1.41*  FERRITIN 94 75 102  CRP 10.9* 3.8* 3.4*    No results found for: BNP  Recent Labs  Lab 01/04/20 1146  PROCALCITON <0.10    No results found for: SARSCOV2NAA    Prone/Incentive Spirometry: encouraged  incentive spirometry use 3-4/hour.  Transaminitis: Mild-likely secondary to COVID-19-follow for now.  Elevated BP without diagnosis of hypertension: BP better controlled-continue amlodipine 10 mg daily.  Follow and adjust.    Alcohol abuse: Claims she drinks 1 small bottle of Hennessy almost every day-claims that since she got sick-she is not had a drink-but not exactly sure when her last drink was.  Currently no signs of withdrawal-probably is out of the window for withdrawal symptoms.  Vent Settings: N/A  Condition - Stable  Family Communication  : Patient prefers to update family himself-I have asked her to let me know if family has questions.  Code Status :  Full Code  Diet :  Diet Order            Diet regular Room service appropriate? Yes; Fluid consistency: Thin  Diet effective now                  Disposition Plan  :   Status is: Inpatient  Remains inpatient appropriate because:Inpatient level of care appropriate due to severity of illness   Dispo: The patient is from: Home               Anticipated d/c is to: Home              Anticipated d/c date is: 1 days              Patient currently is not medically stable to d/c.    Barriers to discharge: COVID-19 pneumonia with PE-on IV anticoagulation and will need to complete 5 days of IV Remdesivir  Antimicorbials  :    Anti-infectives (From admission, onward)   Start     Dose/Rate Route Frequency Ordered Stop   01/05/20 1000  remdesivir 100 mg in sodium chloride 0.9 % 100 mL IVPB  Status:  Discontinued       "Followed by" Linked Group Details   100 mg 200 mL/hr over 30 Minutes Intravenous Daily 01/04/20 1125 01/04/20 1135   01/05/20 1000  remdesivir 100 mg in sodium chloride 0.9 % 100 mL IVPB        100 mg 200 mL/hr over 30 Minutes Intravenous Daily 01/04/20 1136 01/08/20 1000   01/04/20 1200  remdesivir 100 mg in sodium chloride 0.9 % 100 mL IVPB        100 mg 200 mL/hr over 30 Minutes Intravenous Every 30 min 01/04/20 1136 01/04/20 1331   01/04/20 1130  remdesivir 200 mg in sodium chloride 0.9% 250 mL IVPB  Status:  Discontinued       "Followed by" Linked Group Details   200 mg 580 mL/hr over 30 Minutes Intravenous Once 01/04/20 1125 01/04/20 1135      Inpatient Medications  Scheduled Meds: . acetaminophen  1,000 mg Oral Q8H  . amLODipine  10 mg Oral Daily  . apixaban  10 mg Oral BID   Followed by  . [START ON 01/13/2020] apixaban  5 mg Oral BID  . vitamin C  500 mg Oral Daily  . benzonatate  200 mg Oral TID  . folic acid  1 mg Oral Daily  . multivitamin with minerals  1 tablet Oral Daily  .  nicotine  14 mg Transdermal Daily  . predniSONE  50 mg Oral Daily  . sodium chloride flush  3 mL Intravenous Q12H  . thiamine  100 mg Oral Daily   Or  . thiamine  100 mg Intravenous Daily  . zinc sulfate  220 mg Oral Daily   Continuous Infusions: . sodium chloride     PRN Meds:.sodium chloride, albuterol, bisacodyl, chlorpheniramine-HYDROcodone, guaiFENesin-dextromethorphan, hydrALAZINE, ondansetron **OR**  ondansetron (ZOFRAN) IV, oxyCODONE, polyethylene glycol, sodium chloride flush   Time Spent in minutes  25 See all Orders from today for further details   Jeoffrey Massed M.D on 01/08/2020 at 3:57 PM  To page go to [email or web address] - use universal password  [facility] -  Office  [phone]    Objective:   Vitals:   01/07/20 1631 01/07/20 2127 01/08/20 0757 01/08/20 0800  BP: (!) 170/101 (!) 141/99 (!) 143/81   Pulse: 90 76 90   Resp: 20 20 16 20   Temp: 97.9 F (36.6 C) 98.3 F (36.8 C) 98.4 F (36.9 C)   TempSrc:      SpO2: 100% 100%  99%  Weight:      Height:        Wt Readings from Last 3 Encounters:  01/04/20 72.6 kg  07/28/16 68.3 kg  02/20/14 62.1 kg     Intake/Output Summary (Last 24 hours) at 01/08/2020 1557 Last data filed at 01/08/2020 0841 Gross per 24 hour  Intake 690 ml  Output --  Net 690 ml     Physical Exam Gen Exam:Alert awake-not in any distress HEENT:atraumatic, normocephalic Chest: B/L clear to auscultation anteriorly CVS:S1S2 regular Abdomen:soft non tender, non distended Extremities:no edema Neurology: Non focal Skin: no rash   Data Review:    CBC Recent Labs  Lab 01/05/20 0039 01/05/20 0954 01/06/20 0140 01/07/20 1023 01/08/20 1415  WBC 11.6* 13.5* 14.1* 10.3 13.2*  HGB 13.7 14.1 13.9 14.3 14.3  HCT 41.0 41.2 40.6 42.5 42.2  PLT 303 327 354 363 378  MCV 91.9 93.0 92.9 93.6 93.6  MCH 30.7 31.8 31.8 31.5 31.7  MCHC 33.4 34.2 34.2 33.6 33.9  RDW 11.9 11.9 11.9 12.1 12.1    Chemistries  Recent Labs  Lab 01/05/20 0039 01/05/20 0954 01/06/20 0140 01/07/20 1023 01/08/20 1415  NA 136 133* 138 138 136  K 3.9 3.5 3.8 3.4* 4.4  CL 102 100 101 101 100  CO2 24 25 27 29 24   GLUCOSE 128* 181* 141* 153* 140*  BUN 7 6 7 8 6   CREATININE 0.66 0.64 0.58 0.82 0.64  CALCIUM 9.0 9.0 9.1 9.0 9.0  MG 1.8 1.8 2.0 1.9 2.0  AST 27 27 27  38 98*  ALT 22 25 26 29  76*  ALKPHOS 62 61 59 60 66  BILITOT 0.6 0.5 0.4 0.4 0.4    ------------------------------------------------------------------------------------------------------------------ No results for input(s): CHOL, HDL, LDLCALC, TRIG, CHOLHDL, LDLDIRECT in the last 72 hours.  No results found for: HGBA1C ------------------------------------------------------------------------------------------------------------------ No results for input(s): TSH, T4TOTAL, T3FREE, THYROIDAB in the last 72 hours.  Invalid input(s): FREET3 ------------------------------------------------------------------------------------------------------------------ Recent Labs    01/07/20 1023 01/08/20 1415  FERRITIN 75 102    Coagulation profile No results for input(s): INR, PROTIME in the last 168 hours.  Recent Labs    01/07/20 1023 01/08/20 1415  DDIMER 1.66* 1.41*    Cardiac Enzymes No results for input(s): CKMB, TROPONINI, MYOGLOBIN in the last 168 hours.  Invalid input(s): CK ------------------------------------------------------------------------------------------------------------------ No results found for: BNP  Micro Results No results  found for this or any previous visit (from the past 240 hour(s)).  Radiology Reports CT Angio Chest PE W/Cm &/Or Wo Cm  Result Date: 01/04/2020 CLINICAL DATA:  High probability for pulmonary embolism EXAM: CT ANGIOGRAPHY CHEST WITH CONTRAST TECHNIQUE: Multidetector CT imaging of the chest was performed using the standard protocol during bolus administration of intravenous contrast. Multiplanar CT image reconstructions and MIPs were obtained to evaluate the vascular anatomy. CONTRAST:  37mL OMNIPAQUE IOHEXOL 350 MG/ML SOLN COMPARISON:  None. FINDINGS: Cardiovascular: Bilateral lobar pulmonary emboli, involving all lobes on the left and involving the lower lobe on the right. Segmental clot is seen at the right upper lobe anteriorly. Basilar segment infarct posteriorly on the left with small reactive appearing effusion. Negative for  failure. Normal RV to LV ratio. No pericardial effusion. Negative aorta. Mediastinum/Nodes: Negative for adenopathy or mass Lungs/Pleura: Ground-glass opacity in a segmental distribution at the left base with trace effusion. Upper Abdomen: Negative Musculoskeletal: Negative Review of the MIP images confirms the above findings. Critical Value/emergent results were called by telephone at the time of interpretation on 01/04/2020 at 10:41 am to provider [facility] , who verbally acknowledged these results. IMPRESSION: Acute lobar and segmental pulmonary emboli on both sides with occlusive clot and infarct in the left lower lobe. No right heart dilatation. Electronically Signed   By: Marnee Spring M.D.   On: 01/04/2020 10:43   DG Chest Portable 1 View  Result Date: 01/03/2020 CLINICAL DATA:  Shortness of breath, COVID-19 positive 12/30/2019 EXAM: PORTABLE CHEST 1 VIEW COMPARISON:  None. FINDINGS: No consolidation, features of edema, pneumothorax, or effusion. Pulmonary vascularity is normally distributed. The cardiomediastinal contours are unremarkable. No acute osseous or soft tissue abnormality. Small radiodensity projecting over the mid chest, possibly external to the patient. IMPRESSION: No acute cardiopulmonary abnormality. Tiny metallic rounded density projecting over midline chest, likely external, correlate with visual inspection. Electronically Signed   By: Kreg Shropshire M.D.   On: 01/03/2020 22:15   ECHOCARDIOGRAM COMPLETE  Result Date: 01/05/2020    ECHOCARDIOGRAM REPORT   Patient Name:   Ann Mcmillan Date of Exam: 01/05/2020 Medical Rec #:  [id number]   Height:       62.0 in Accession #:    [id number]  Weight:       160.0 lb Date of Birth:  Mar 20, 1986   BSA:          1.739 m Patient Age:    34 years    BP:           167/103 mmHg Patient Gender: F           HR:           86 bpm. Exam Location:  Inpatient Procedure: 2D Echo Indications:    pulmonary embolus 415.19  History:        Patient has no prior  history of Echocardiogram examinations.                 Risk Factors:Current Smoker. Covid + . pulmonary emboli.  Sonographer:    Celene Skeen RDCS (AE) Referring Phys: [id number] Dewayne Shorter M Kaylene Dawn IMPRESSIONS  1. Left ventricular ejection fraction, by estimation, is 55 to 60%. The left ventricle has normal function. The left ventricle has no regional wall motion abnormalities. Left ventricular diastolic parameters were normal.  2. Right ventricular systolic function is normal. The right ventricular size is normal. Tricuspid regurgitation signal is inadequate for assessing PA pressure.  3. The mitral valve is grossly normal. Trivial  mitral valve regurgitation. No evidence of mitral stenosis.  4. The aortic valve is tricuspid. Aortic valve regurgitation is not visualized. No aortic stenosis is present.  5. The inferior vena cava is normal in size with greater than 50% respiratory variability, suggesting right atrial pressure of 3 mmHg. Conclusion(s)/Recommendation(s): Normal biventricular function without evidence of hemodynamically significant valvular heart disease. FINDINGS  Left Ventricle: Left ventricular ejection fraction, by estimation, is 55 to 60%. The left ventricle has normal function. The left ventricle has no regional wall motion abnormalities. The left ventricular internal cavity size was normal in size. There is  no left ventricular hypertrophy. Left ventricular diastolic parameters were normal. Right Ventricle: The right ventricular size is normal. No increase in right ventricular wall thickness. Right ventricular systolic function is normal. Tricuspid regurgitation signal is inadequate for assessing PA pressure. Left Atrium: Left atrial size was normal in size. Right Atrium: Right atrial size was normal in size. Pericardium: Trivial pericardial effusion is present. Mitral Valve: The mitral valve is grossly normal. Trivial mitral valve regurgitation. No evidence of mitral valve stenosis. Tricuspid Valve:  The tricuspid valve is grossly normal. Tricuspid valve regurgitation is trivial. No evidence of tricuspid stenosis. Aortic Valve: The aortic valve is tricuspid. Aortic valve regurgitation is not visualized. No aortic stenosis is present. Pulmonic Valve: The pulmonic valve was grossly normal. Pulmonic valve regurgitation is not visualized. No evidence of pulmonic stenosis. Aorta: The aortic root and ascending aorta are structurally normal, with no evidence of dilitation. Venous: The right upper pulmonary vein is normal. The inferior vena cava is normal in size with greater than 50% respiratory variability, suggesting right atrial pressure of 3 mmHg. IAS/Shunts: The atrial septum is grossly normal.  LEFT VENTRICLE PLAX 2D LVIDd:         5.33 cm  Diastology LVIDs:         3.91 cm  LV e' medial:    6.42 cm/s LV PW:         0.90 cm  LV E/e' medial:  12.6 LV IVS:        0.73 cm  LV e' lateral:   9.36 cm/s LVOT diam:     2.10 cm  LV E/e' lateral: 8.6 LV SV:         61 LV SV Index:   35 LVOT Area:     3.46 cm  RIGHT VENTRICLE RV S prime:     13.10 cm/s TAPSE (M-mode): 3.1 cm LEFT ATRIUM             Index       RIGHT ATRIUM           Index LA diam:        3.30 cm 1.90 cm/m  RA Area:     16.10 cm LA Vol (A2C):   35.6 ml 20.47 ml/m RA Volume:   41.20 ml  23.70 ml/m LA Vol (A4C):   22.0 ml 12.65 ml/m LA Biplane Vol: 28.6 ml 16.45 ml/m  AORTIC VALVE LVOT Vmax:   91.40 cm/s LVOT Vmean:  61.100 cm/s LVOT VTI:    0.177 m  AORTA Ao Root diam: 2.80 cm MITRAL VALVE MV Area (PHT): 2.56 cm    SHUNTS MV Decel Time: 296 msec    Systemic VTI:  0.18 m MV E velocity: 80.60 cm/s  Systemic Diam: 2.10 cm MV A velocity: 90.40 cm/s MV E/A ratio:  0.89 Lennie Odor MD Electronically signed by Lennie Odor MD Signature Date/Time: 01/05/2020/2:59:24 PM    Final  VAS US LOWER EXTREMITY VENOUS (DVT)  Result Date: 01/05/2020  Lower Venous DVTStudy Indications: Pain, Swelling, and + Covid.  Risk Factors: None identified. Performing  Technologist: Jannet AskewVernon Matacale RCT RDMS  Examination Guidelines: A complete evaluation includes B-mode imaging, spectral Doppler, color Doppler, and power Doppler as needed of all accessible portions of each vessel. Bilateral testing is considered an integral part of a complete examination. Limited examinations for reoccurring indications may be performed as noted. The reflux portion of the exam is performed with the patient in reverse Trendelenburg.  +---------+---------------+---------+-----------+----------+--------------+ RIGHT    CompressibilityPhasicitySpontaneityPropertiesThrombus Aging +---------+---------------+---------+-----------+----------+--------------+ CFV      Full           Yes      Yes                                 +---------+---------------+---------+-----------+----------+--------------+ SFJ      Full                                                        +---------+---------------+---------+-----------+----------+--------------+ FV Prox  Full                                                        +---------+---------------+---------+-----------+----------+--------------+ FV Mid   Full                                                        +---------+---------------+---------+-----------+----------+--------------+ FV DistalFull                                                        +---------+---------------+---------+-----------+----------+--------------+ PFV      Full                                                        +---------+---------------+---------+-----------+----------+--------------+ POP      Full           Yes      Yes                                 +---------+---------------+---------+-----------+----------+--------------+ PTV      Full                                                        +---------+---------------+---------+-----------+----------+--------------+ PERO     Full                                                         +---------+---------------+---------+-----------+----------+--------------+   +---------+---------------+---------+-----------+----------+--------------+  LEFT     CompressibilityPhasicitySpontaneityPropertiesThrombus Aging +---------+---------------+---------+-----------+----------+--------------+ CFV      Full           Yes      Yes                                 +---------+---------------+---------+-----------+----------+--------------+ SFJ      Full                                                        +---------+---------------+---------+-----------+----------+--------------+ FV Prox  Full                                                        +---------+---------------+---------+-----------+----------+--------------+ FV Mid   Full                                                        +---------+---------------+---------+-----------+----------+--------------+ FV DistalFull                                                        +---------+---------------+---------+-----------+----------+--------------+ PFV      Full                                                        +---------+---------------+---------+-----------+----------+--------------+ POP      Full           Yes      Yes                                 +---------+---------------+---------+-----------+----------+--------------+ PTV      Full                                                        +---------+---------------+---------+-----------+----------+--------------+ PERO     Full                                                        +---------+---------------+---------+-----------+----------+--------------+     Summary: RIGHT: - There is no evidence of deep vein thrombosis in the lower extremity.  - No cystic structure found in the popliteal fossa.  LEFT: - There is no evidence of deep vein thrombosis in the lower extremity.  - No cystic  structure found  in the popliteal fossa.  *See table(s) above for measurements and observations. Electronically signed by Lemar Livings MD on 01/05/2020 at 12:04:19 PM.    Final

## 2020-01-09 DIAGNOSIS — R079 Chest pain, unspecified: Secondary | ICD-10-CM

## 2020-01-09 LAB — CBC
HCT: 38.8 % (ref 36.0–46.0)
Hemoglobin: 12.9 g/dL (ref 12.0–15.0)
MCH: 31.2 pg (ref 26.0–34.0)
MCHC: 33.2 g/dL (ref 30.0–36.0)
MCV: 93.7 fL (ref 80.0–100.0)
Platelets: 360 10*3/uL (ref 150–400)
RBC: 4.14 MIL/uL (ref 3.87–5.11)
RDW: 12.2 % (ref 11.5–15.5)
WBC: 13.9 10*3/uL — ABNORMAL HIGH (ref 4.0–10.5)
nRBC: 0 % (ref 0.0–0.2)

## 2020-01-09 LAB — COMPREHENSIVE METABOLIC PANEL
ALT: 103 U/L — ABNORMAL HIGH (ref 0–44)
AST: 82 U/L — ABNORMAL HIGH (ref 15–41)
Albumin: 2.7 g/dL — ABNORMAL LOW (ref 3.5–5.0)
Alkaline Phosphatase: 73 U/L (ref 38–126)
Anion gap: 10 (ref 5–15)
BUN: 10 mg/dL (ref 6–20)
CO2: 28 mmol/L (ref 22–32)
Calcium: 8.9 mg/dL (ref 8.9–10.3)
Chloride: 98 mmol/L (ref 98–111)
Creatinine, Ser: 0.58 mg/dL (ref 0.44–1.00)
GFR calc Af Amer: >60 mL/min (ref >60)
GFR calc non Af Amer: >60 mL/min (ref >60)
Glucose, Bld: 105 mg/dL — ABNORMAL HIGH (ref 70–99)
Potassium: 3.7 mmol/L (ref 3.5–5.1)
Sodium: 136 mmol/L (ref 135–145)
Total Bilirubin: 0.3 mg/dL (ref 0.3–1.2)
Total Protein: 6.7 g/dL (ref 6.5–8.1)

## 2020-01-09 LAB — GLUCOSE, CAPILLARY: Glucose-Capillary: 118 mg/dL — ABNORMAL HIGH (ref 70–99)

## 2020-01-09 LAB — D-DIMER, QUANTITATIVE: D-Dimer, Quant: 1.65 ug/mL-FEU — ABNORMAL HIGH (ref 0.00–0.50)

## 2020-01-09 LAB — FERRITIN: Ferritin: 88 ng/mL (ref 11–307)

## 2020-01-09 LAB — C-REACTIVE PROTEIN: CRP: 3.6 mg/dL — ABNORMAL HIGH (ref <1.0)

## 2020-01-09 NOTE — Progress Notes (Signed)
PROGRESS NOTE                                                                                                                                                                                                             Patient Demographics:    Ann Mcmillan, is a 34 y.o. female, DOB - 18-Nov-1985, WCB:[id number]  Outpatient Primary MD for the patient is Patient, No Pcp Per   Admit date - 01/03/2020   LOS - 5  Chief Complaint  Patient presents with  . COVID       Brief Narrative: Patient is a 34 y.o. female with PMHx of bronchial asthma-tested positive for COVID-19 on 9/7-presented with several days history of shortness of breath, chest pain-found to have pulmonary embolism in the setting of COVID-19 pneumonia-subsequently admitted to the hospitalist service.  COVID-19 vaccinated status: Unvaccinated  Significant Events: 9/7>> COVID-19 positive 9/15>> presented to [facility] with pleuritic chest pain, shortness of breath-found to have PE.  Significant studies: 9/15>>Chest x-ray: No acute cardiopulmonary abnormality. 9/16>> CTA chest: Acute lobar/segmental pulmonary emboli on both sides. Infarct in the left lower lobe. 9/17>> Echo: EF 65-60%, RV systolic function normal. 9/17>> bilateral lower extremity Doppler: No DVT  COVID-19 medications: Steroids: 9/16>> Remdesivir: 9/16>> 9/20  Antibiotics: None  Microbiology data: None  Procedures: None  Consults: None  DVT prophylaxis: Heparin infusion>> Eliquis    Subjective:   Continues to be anxious about discharge plans-continues to complain that she still has pleuritic left-sided chest pain and musculoskeletal pain. Complains of fatigue/malaise and ongoing coughing spells. Subsequently this MD spent a lot of time providing reassurance.   Assessment  & Plan :   Pulmonary embolism: Provoked by COVID-19-on room air-hemodynamically stable-no RV strain on echo, no DVT  seen in lower extremity Doppler.  Initially on IV heparin-has been transitioned to Eliquis. Continues to have pleuritic chest pain which is likely secondary to pulmonary infarction-also has what appears to be a significant musculoskeletal component (easily reproducible with palpation) which is likely secondary to coughing spells. Continue scheduled Tylenol-as needed oxycodone, Flexeril and transdermal Lidoderm. Extensive reassurance provided to patient-unfortunately she continues to be very anxious and tearful that she still does not feel any better in terms of her chest pain in spite of being hospitalized for 5 days. She also is frustrated by the overall situation-and the likelihood that she  will not be able to return to work and to her usual lifestyle even after discharge from the hospital. Unfortunately-it is more likely than not that she will continue to have these issues for the next few weeks. I attempted to reassure her that her EKG/echo/cardiac enzymes are all unremarkable-and over time-for coughing spells/pain/malaise/fatigue will get better. One of my partner's- will see her tomorrow for a second opinion. Have consulted case management for medication assistance and outpatient follow-up.  COVID-19 pneumonia: On room air-Remdesivir completed on 9/20-continue tapering steroids. CRP continues to slowly downtrend.   Fever: afebrile  O2 requirements:  SpO2: 100 % O2 Flow Rate (L/min): 3 L/min   COVID-19 Labs: Recent Labs    01/07/20 1023 01/08/20 1415 01/09/20 0617  DDIMER 1.66* 1.41* 1.65*  FERRITIN 75 102 88  CRP 3.8* 3.4* 3.6*    No results found for: BNP  Recent Labs  Lab 01/04/20 1146  PROCALCITON <0.10    No results found for: SARSCOV2NAA    Prone/Incentive Spirometry: encouraged  incentive spirometry use 3-4/hour.  Transaminitis: Mild-likely secondary to COVID-19-follow for now.  Elevated BP without diagnosis of hypertension: BP better controlled-continue amlodipine 10  mg daily.  Follow and adjust.    Alcohol abuse: Claims she drinks 1 small bottle of Hennessy almost every day-claims that since she got sick-she is not had a drink-but not exactly sure when her last drink was.  Currently no signs of withdrawal-probably is out of the window for withdrawal symptoms.   Vent Settings: N/A  Condition - Stable  Family Communication  : Patient prefers to update family himself-I have asked her to let me know if family has questions.  Code Status :  Full Code  Diet :  Diet Order            Diet regular Room service appropriate? Yes; Fluid consistency: Thin  Diet effective now                  Disposition Plan  :   Status is: Inpatient  Remains inpatient appropriate because:Inpatient level of care appropriate due to severity of illness   Dispo: The patient is from: Home              Anticipated d/c is to: Home              Anticipated d/c date is: 1 days              Patient currently is not medically stable to d/c.    Barriers to discharge: COVID-19 pneumonia with PE-on anticoagulation-continues to have chest pain. Needs further inpatient optimization before discharge.  Antimicorbials  :    Anti-infectives (From admission, onward)   Start     Dose/Rate Route Frequency Ordered Stop   01/05/20 1000  remdesivir 100 mg in sodium chloride 0.9 % 100 mL IVPB  Status:  Discontinued       "Followed by" Linked Group Details   100 mg 200 mL/hr over 30 Minutes Intravenous Daily 01/04/20 1125 01/04/20 1135   01/05/20 1000  remdesivir 100 mg in sodium chloride 0.9 % 100 mL IVPB        100 mg 200 mL/hr over 30 Minutes Intravenous Daily 01/04/20 1136 01/08/20 1000   01/04/20 1200  remdesivir 100 mg in sodium chloride 0.9 % 100 mL IVPB        100 mg 200 mL/hr over 30 Minutes Intravenous Every 30 min 01/04/20 1136 01/04/20 1331   01/04/20 1130  remdesivir 200 mg  in sodium chloride 0.9% 250 mL IVPB  Status:  Discontinued       "Followed by" Linked Group  Details   200 mg 580 mL/hr over 30 Minutes Intravenous Once 01/04/20 1125 01/04/20 1135      Inpatient Medications  Scheduled Meds: . acetaminophen  1,000 mg Oral Q8H  . amLODipine  10 mg Oral Daily  . apixaban  10 mg Oral BID   Followed by  . [START ON 01/13/2020] apixaban  5 mg Oral BID  . vitamin C  500 mg Oral Daily  . benzonatate  200 mg Oral TID  . folic acid  1 mg Oral Daily  . lidocaine  1 patch Transdermal q1800  . multivitamin with minerals  1 tablet Oral Daily  . nicotine  14 mg Transdermal Daily  . predniSONE  50 mg Oral Daily  . sodium chloride flush  3 mL Intravenous Q12H  . thiamine  100 mg Oral Daily   Or  . thiamine  100 mg Intravenous Daily  . zinc sulfate  220 mg Oral Daily   Continuous Infusions: . sodium chloride     PRN Meds:.sodium chloride, albuterol, bisacodyl, chlorpheniramine-HYDROcodone, cyclobenzaprine, guaiFENesin-dextromethorphan, hydrALAZINE, ondansetron **OR** ondansetron (ZOFRAN) IV, oxyCODONE, polyethylene glycol, sodium chloride flush   Time Spent in minutes  25 See all Orders from today for further details   Jeoffrey Massed M.D on 01/09/2020 at 2:26 PM  To page go to [email or web address] - use universal password  [facility] -  Office  [phone]    Objective:   Vitals:   01/08/20 1615 01/08/20 2016 01/09/20 0240 01/09/20 0814  BP: 139/90 (!) 155/96  119/79  Pulse: 82 85 67 84  Resp: Temp:  97.9 F (36.6 C)  98 F (36.7 C)  TempSrc:      SpO2:  100%  100%  Weight:      Height:        Wt Readings from Last 3 Encounters:  01/04/20 72.6 kg  07/28/16 68.3 kg  02/20/14 62.1 kg     Intake/Output Summary (Last 24 hours) at 01/09/2020 1426 Last data filed at 01/09/2020 1000 Gross per 24 hour  Intake 1090 ml  Output --  Net 1090 ml     Physical Exam Gen Exam:Alert awake-not in any distress HEENT:atraumatic, normocephalic Chest: B/L clear to auscultation anteriorly. Continues to have tenderness to the  precordial area with gentle palpation. CVS:S1S2 regular Abdomen:soft non tender, non distended Extremities:no edema Neurology: Non focal Skin: no rash   Data Review:    CBC Recent Labs  Lab 01/05/20 0954 01/06/20 0140 01/07/20 1023 01/08/20 1415 01/09/20 0617  WBC 13.5* 14.1* 10.3 13.2* 13.9*  HGB 14.1 13.9 14.3 14.3 12.9  HCT 41.2 40.6 42.5 42.2 38.8  PLT 327 354 363 378 360  MCV 93.0 92.9 93.6 93.6 93.7  MCH 31.8 31.8 31.5 31.7 31.2  MCHC 34.2 34.2 33.6 33.9 33.2  RDW 11.9 11.9 12.1 12.1 12.2    Chemistries  Recent Labs  Lab 01/05/20 0039 01/05/20 0039 01/05/20 0954 01/06/20 0140 01/07/20 1023 01/08/20 1415 01/09/20 0617  NA 136   < > 133* 138 138 136 136  K 3.9   < > 3.5 3.8 3.4* 4.4 3.7  CL 102   < > 100 101 101 100 98  CO2 24   < > GLUCOSE 128*   < > 181* 141* 153* 140* 105*  BUN 7   < >  6 7 8 6 10   CREATININE 0.66   < > 0.64 0.58 0.82 0.64 0.58  CALCIUM 9.0   < > 9.0 9.1 9.0 9.0 8.9  MG 1.8  --  1.8 2.0 1.9 2.0  --   AST 27   < > 27 27 38 98* 82*  ALT 22   < > 25 26 29  76* 103*  ALKPHOS 62   < > 61 59 60 66 73  BILITOT 0.6   < > 0.5 0.4 0.4 0.4 0.3   < > = values in this interval not displayed.   ------------------------------------------------------------------------------------------------------------------ No results for input(s): CHOL, HDL, LDLCALC, TRIG, CHOLHDL, LDLDIRECT in the last 72 hours.  No results found for: HGBA1C ------------------------------------------------------------------------------------------------------------------ No results for input(s): TSH, T4TOTAL, T3FREE, THYROIDAB in the last 72 hours.  Invalid input(s): FREET3 ------------------------------------------------------------------------------------------------------------------ Recent Labs    01/08/20 1415 01/09/20 0617  FERRITIN 102 88    Coagulation profile No results for input(s): INR, PROTIME in the last 168 hours.  Recent Labs     01/08/20 1415 01/09/20 0617  DDIMER 1.41* 1.65*    Cardiac Enzymes No results for input(s): CKMB, TROPONINI, MYOGLOBIN in the last 168 hours.  Invalid input(s): CK ------------------------------------------------------------------------------------------------------------------ No results found for: BNP  Micro Results No results found for this or any previous visit (from the past 240 hour(s)).  Radiology Reports CT Angio Chest PE W/Cm &/Or Wo Cm  Result Date: 01/04/2020 CLINICAL DATA:  High probability for pulmonary embolism EXAM: CT ANGIOGRAPHY CHEST WITH CONTRAST TECHNIQUE: Multidetector CT imaging of the chest was performed using the standard protocol during bolus administration of intravenous contrast. Multiplanar CT image reconstructions and MIPs were obtained to evaluate the vascular anatomy. CONTRAST:  75mL OMNIPAQUE IOHEXOL 350 MG/ML SOLN COMPARISON:  None. FINDINGS: Cardiovascular: Bilateral lobar pulmonary emboli, involving all lobes on the left and involving the lower lobe on the right. Segmental clot is seen at the right upper lobe anteriorly. Basilar segment infarct posteriorly on the left with small reactive appearing effusion. Negative for failure. Normal RV to LV ratio. No pericardial effusion. Negative aorta. Mediastinum/Nodes: Negative for adenopathy or mass Lungs/Pleura: Ground-glass opacity in a segmental distribution at the left base with trace effusion. Upper Abdomen: Negative Musculoskeletal: Negative Review of the MIP images confirms the above findings. Critical Value/emergent results were called by telephone at the time of interpretation on 01/04/2020 at 10:41 am to provider [facility] , who verbally acknowledged these results. IMPRESSION: Acute lobar and segmental pulmonary emboli on both sides with occlusive clot and infarct in the left lower lobe. No right heart dilatation. Electronically Signed   By: Marnee SpringJonathon  Watts M.D.   On: 01/04/2020 10:43   DG Chest Portable 1  View  Result Date: 01/03/2020 CLINICAL DATA:  Shortness of breath, COVID-19 positive 12/30/2019 EXAM: PORTABLE CHEST 1 VIEW COMPARISON:  None. FINDINGS: No consolidation, features of edema, pneumothorax, or effusion. Pulmonary vascularity is normally distributed. The cardiomediastinal contours are unremarkable. No acute osseous or soft tissue abnormality. Small radiodensity projecting over the mid chest, possibly external to the patient. IMPRESSION: No acute cardiopulmonary abnormality. Tiny metallic rounded density projecting over midline chest, likely external, correlate with visual inspection. Electronically Signed   By: Kreg ShropshirePrice  DeHay M.D.   On: 01/03/2020 22:15   ECHOCARDIOGRAM COMPLETE  Result Date: 01/05/2020    ECHOCARDIOGRAM REPORT   Patient Name:   Jiles HaroldBONI Brallier Date of Exam: 01/05/2020 Medical Rec #:  [id number]   Height:       62.0 in Accession #:  [id number]  Weight:       160.0 lb Date of Birth:  [birth year]   BSA:          1.739 m Patient Age:    34 years    BP:           167/103 mmHg Patient Gender: F           HR:           86 bpm. Exam Location:  Inpatient Procedure: 2D Echo Indications:    pulmonary embolus 415.19  History:        Patient has no prior history of Echocardiogram examinations.                 Risk Factors:Current Smoker. Covid + . pulmonary emboli.  Sonographer:    Celene Skeen RDCS (AE) Referring Phys: [id number] Dewayne Shorter M Selah Zelman IMPRESSIONS  1. Left ventricular ejection fraction, by estimation, is 55 to 60%. The left ventricle has normal function. The left ventricle has no regional wall motion abnormalities. Left ventricular diastolic parameters were normal.  2. Right ventricular systolic function is normal. The right ventricular size is normal. Tricuspid regurgitation signal is inadequate for assessing PA pressure.  3. The mitral valve is grossly normal. Trivial mitral valve regurgitation. No evidence of mitral stenosis.  4. The aortic valve is tricuspid. Aortic valve regurgitation  is not visualized. No aortic stenosis is present.  5. The inferior vena cava is normal in size with greater than 50% respiratory variability, suggesting right atrial pressure of 3 mmHg. Conclusion(s)/Recommendation(s): Normal biventricular function without evidence of hemodynamically significant valvular heart disease. FINDINGS  Left Ventricle: Left ventricular ejection fraction, by estimation, is 55 to 60%. The left ventricle has normal function. The left ventricle has no regional wall motion abnormalities. The left ventricular internal cavity size was normal in size. There is  no left ventricular hypertrophy. Left ventricular diastolic parameters were normal. Right Ventricle: The right ventricular size is normal. No increase in right ventricular wall thickness. Right ventricular systolic function is normal. Tricuspid regurgitation signal is inadequate for assessing PA pressure. Left Atrium: Left atrial size was normal in size. Right Atrium: Right atrial size was normal in size. Pericardium: Trivial pericardial effusion is present. Mitral Valve: The mitral valve is grossly normal. Trivial mitral valve regurgitation. No evidence of mitral valve stenosis. Tricuspid Valve: The tricuspid valve is grossly normal. Tricuspid valve regurgitation is trivial. No evidence of tricuspid stenosis. Aortic Valve: The aortic valve is tricuspid. Aortic valve regurgitation is not visualized. No aortic stenosis is present. Pulmonic Valve: The pulmonic valve was grossly normal. Pulmonic valve regurgitation is not visualized. No evidence of pulmonic stenosis. Aorta: The aortic root and ascending aorta are structurally normal, with no evidence of dilitation. Venous: The right upper pulmonary vein is normal. The inferior vena cava is normal in size with greater than 50% respiratory variability, suggesting right atrial pressure of 3 mmHg. IAS/Shunts: The atrial septum is grossly normal.  LEFT VENTRICLE PLAX 2D LVIDd:         5.33 cm   Diastology LVIDs:         3.91 cm  LV e' medial:    6.42 cm/s LV PW:         0.90 cm  LV E/e' medial:  12.6 LV IVS:        0.73 cm  LV e' lateral:   9.36 cm/s LVOT diam:     2.10 cm  LV E/e' lateral: 8.6 LV SV:  61 LV SV Index:   35 LVOT Area:     3.46 cm  RIGHT VENTRICLE RV S prime:     13.10 cm/s TAPSE (M-mode): 3.1 cm LEFT ATRIUM             Index       RIGHT ATRIUM           Index LA diam:        3.30 cm 1.90 cm/m  RA Area:     16.10 cm LA Vol (A2C):   35.6 ml 20.47 ml/m RA Volume:   41.20 ml  23.70 ml/m LA Vol (A4C):   22.0 ml 12.65 ml/m LA Biplane Vol: 28.6 ml 16.45 ml/m  AORTIC VALVE LVOT Vmax:   91.40 cm/s LVOT Vmean:  61.100 cm/s LVOT VTI:    0.177 m  AORTA Ao Root diam: 2.80 cm MITRAL VALVE MV Area (PHT): 2.56 cm    SHUNTS MV Decel Time: 296 msec    Systemic VTI:  0.18 m MV E velocity: 80.60 cm/s  Systemic Diam: 2.10 cm MV A velocity: 90.40 cm/s MV E/A ratio:  0.89 Lennie Odor MD Electronically signed by Lennie Odor MD Signature Date/Time: 01/05/2020/2:59:24 PM    Final    VAS [place] LOWER EXTREMITY VENOUS (DVT)  Result Date: 01/05/2020  Lower Venous DVTStudy Indications: Pain, Swelling, and + Covid.  Risk Factors: None identified. Performing Technologist: Jannet Askew RCT RDMS  Examination Guidelines: A complete evaluation includes B-mode imaging, spectral Doppler, color Doppler, and power Doppler as needed of all accessible portions of each vessel. Bilateral testing is considered an integral part of a complete examination. Limited examinations for reoccurring indications may be performed as noted. The reflux portion of the exam is performed with the patient in reverse Trendelenburg.  +---------+---------------+---------+-----------+----------+--------------+ RIGHT    CompressibilityPhasicitySpontaneityPropertiesThrombus Aging +---------+---------------+---------+-----------+----------+--------------+ CFV      Full           Yes      Yes                                  +---------+---------------+---------+-----------+----------+--------------+ SFJ      Full                                                        +---------+---------------+---------+-----------+----------+--------------+ FV Prox  Full                                                        +---------+---------------+---------+-----------+----------+--------------+ FV Mid   Full                                                        +---------+---------------+---------+-----------+----------+--------------+ FV DistalFull                                                        +---------+---------------+---------+-----------+----------+--------------+  PFV      Full                                                        +---------+---------------+---------+-----------+----------+--------------+ POP      Full           Yes      Yes                                 +---------+---------------+---------+-----------+----------+--------------+ PTV      Full                                                        +---------+---------------+---------+-----------+----------+--------------+ PERO     Full                                                        +---------+---------------+---------+-----------+----------+--------------+   +---------+---------------+---------+-----------+----------+--------------+ LEFT     CompressibilityPhasicitySpontaneityPropertiesThrombus Aging +---------+---------------+---------+-----------+----------+--------------+ CFV      Full           Yes      Yes                                 +---------+---------------+---------+-----------+----------+--------------+ SFJ      Full                                                        +---------+---------------+---------+-----------+----------+--------------+ FV Prox  Full                                                         +---------+---------------+---------+-----------+----------+--------------+ FV Mid   Full                                                        +---------+---------------+---------+-----------+----------+--------------+ FV DistalFull                                                        +---------+---------------+---------+-----------+----------+--------------+ PFV      Full                                                        +---------+---------------+---------+-----------+----------+--------------+  POP      Full           Yes      Yes                                 +---------+---------------+---------+-----------+----------+--------------+ PTV      Full                                                        +---------+---------------+---------+-----------+----------+--------------+ PERO     Full                                                        +---------+---------------+---------+-----------+----------+--------------+     Summary: RIGHT: - There is no evidence of deep vein thrombosis in the lower extremity.  - No cystic structure found in the popliteal fossa.  LEFT: - There is no evidence of deep vein thrombosis in the lower extremity.  - No cystic structure found in the popliteal fossa.  *See table(s) above for measurements and observations. Electronically signed by Lemar Livings MD on 01/05/2020 at 12:04:19 PM.    Final

## 2020-01-09 NOTE — Plan of Care (Signed)

## 2020-01-09 NOTE — Discharge Instructions (Signed)
Information on my medicine - ELIQUIS (apixaban)  Why was Eliquis prescribed for you? Eliquis was prescribed to treat blood clots that may have been found in the veins of your legs (deep vein thrombosis) or in your lungs (pulmonary embolism) and to reduce the risk of them occurring again.  What do You need to know about Eliquis ? The starting dose is 10 mg (two 5 mg tablets) taken TWICE daily for the FIRST SEVEN (7) DAYS, then on 01/13/19  the dose is reduced to ONE 5 mg tablet taken TWICE daily.  Eliquis may be taken with or without food.   Try to take the dose about the same time in the morning and in the evening. If you have difficulty swallowing the tablet whole please discuss with your pharmacist how to take the medication safely.  Take Eliquis exactly as prescribed and DO NOT stop taking Eliquis without talking to the doctor who prescribed the medication.  Stopping may increase your risk of developing a new blood clot.  Refill your prescription before you run out.  After discharge, you should have regular check-up appointments with your healthcare provider that is prescribing your Eliquis.    What do you do if you miss a dose? If a dose of ELIQUIS is not taken at the scheduled time, take it as soon as possible on the same day and twice-daily administration should be resumed. The dose should not be doubled to make up for a missed dose.  Important Safety Information A possible side effect of Eliquis is bleeding. You should call your healthcare provider right away if you experience any of the following: ? Bleeding from an injury or your nose that does not stop. ? Unusual colored urine (red or dark brown) or unusual colored stools (red or black). ? Unusual bruising for unknown reasons. ? A serious fall or if you hit your head (even if there is no bleeding).  Some medicines may interact with Eliquis and might increase your risk of bleeding or clotting while on Eliquis. To help  avoid this, consult your healthcare provider or pharmacist prior to using any new prescription or non-prescription medications, including herbals, vitamins, non-steroidal anti-inflammatory drugs (NSAIDs) and supplements.  This website has more information on Eliquis (apixaban): http://www.eliquis.com/eliquis/home

## 2020-01-09 NOTE — TOC Progression Note (Addendum)
Transition of Care [facility]) - Progression Note    Patient Details  Name: Ann Mcmillan MRN: [id number] Date of Birth: [birth year]  Transition of [facility]) CM/SW Contact  Beckie Busing, RN Phone Number: [phone]  01/09/2020, 3:59 PM  Clinical Narrative:    MATCH completed for medication assist. Patient has been set up with a pcp appointment post discharge. Details have been added to the avs. Patient has been made aware.         Expected Discharge Plan and Services                                                 Social Determinants of Health (SDOH) Interventions    Readmission Risk Interventions No flowsheet data found.

## 2020-01-10 DIAGNOSIS — U071 COVID-19: Principal | ICD-10-CM

## 2020-01-10 DIAGNOSIS — I2699 Other pulmonary embolism without acute cor pulmonale: Secondary | ICD-10-CM

## 2020-01-10 LAB — COMPREHENSIVE METABOLIC PANEL
ALT: 103 U/L — ABNORMAL HIGH (ref 0–44)
AST: 58 U/L — ABNORMAL HIGH (ref 15–41)
Albumin: 3 g/dL — ABNORMAL LOW (ref 3.5–5.0)
Alkaline Phosphatase: 72 U/L (ref 38–126)
Anion gap: 11 (ref 5–15)
BUN: 8 mg/dL (ref 6–20)
CO2: 29 mmol/L (ref 22–32)
Calcium: 9.1 mg/dL (ref 8.9–10.3)
Chloride: 96 mmol/L — ABNORMAL LOW (ref 98–111)
Creatinine, Ser: 0.66 mg/dL (ref 0.44–1.00)
GFR calc Af Amer: >60 mL/min (ref >60)
GFR calc non Af Amer: >60 mL/min (ref >60)
Glucose, Bld: 98 mg/dL (ref 70–99)
Potassium: 3.8 mmol/L (ref 3.5–5.1)
Sodium: 136 mmol/L (ref 135–145)
Total Bilirubin: 0.2 mg/dL — ABNORMAL LOW (ref 0.3–1.2)
Total Protein: 7.3 g/dL (ref 6.5–8.1)

## 2020-01-10 LAB — CBC
HCT: 40.9 % (ref 36.0–46.0)
Hemoglobin: 13.6 g/dL (ref 12.0–15.0)
MCH: 30.8 pg (ref 26.0–34.0)
MCHC: 33.3 g/dL (ref 30.0–36.0)
MCV: 92.7 fL (ref 80.0–100.0)
Platelets: 321 10*3/uL (ref 150–400)
RBC: 4.41 MIL/uL (ref 3.87–5.11)
RDW: 12 % (ref 11.5–15.5)
WBC: 16.7 10*3/uL — ABNORMAL HIGH (ref 4.0–10.5)
nRBC: 0 % (ref 0.0–0.2)

## 2020-01-10 LAB — MAGNESIUM: Magnesium: 1.9 mg/dL (ref 1.7–2.4)

## 2020-01-10 LAB — FERRITIN: Ferritin: 82 ng/mL (ref 11–307)

## 2020-01-10 LAB — C-REACTIVE PROTEIN: CRP: 2 mg/dL — ABNORMAL HIGH (ref <1.0)

## 2020-01-10 LAB — D-DIMER, QUANTITATIVE: D-Dimer, Quant: 1.22 ug/mL-FEU — ABNORMAL HIGH (ref 0.00–0.50)

## 2020-01-10 MED ORDER — LIDOCAINE 5 % EX PTCH: 1.0000 | MEDICATED_PATCH | Freq: Every day | CUTANEOUS | 0 refills | Status: DC

## 2020-01-10 MED ORDER — CYCLOBENZAPRINE HCL 5 MG PO TABS: 5.0000 mg | ORAL_TABLET | Freq: Three times a day (TID) | ORAL | 0 refills | Status: DC | PRN

## 2020-01-10 MED ORDER — ALBUTEROL SULFATE HFA 108 (90 BASE) MCG/ACT IN AERS: 2.0000 | INHALATION_SPRAY | RESPIRATORY_TRACT | 0 refills | Status: DC | PRN

## 2020-01-10 MED ORDER — APIXABAN 5 MG PO TABS: 5.0000 mg | ORAL_TABLET | Freq: Two times a day (BID) | ORAL | 1 refills | Status: DC

## 2020-01-10 MED ORDER — NICOTINE 14 MG/24HR TD PT24: 14.0000 mg | MEDICATED_PATCH | Freq: Every day | TRANSDERMAL | 0 refills | Status: AC

## 2020-01-10 MED ORDER — APIXABAN 5 MG PO TABS: 10.0000 mg | ORAL_TABLET | Freq: Two times a day (BID) | ORAL | 0 refills | Status: DC

## 2020-01-10 MED ORDER — ACETAMINOPHEN 500 MG PO TABS
1000.0000 mg | ORAL_TABLET | Freq: Three times a day (TID) | ORAL | 0 refills | Status: DC | PRN
Start: 2020-01-10 — End: 2022-01-16

## 2020-01-10 MED ORDER — FOLIC ACID 1 MG PO TABS: 1.0000 mg | ORAL_TABLET | Freq: Every day | ORAL | 0 refills | Status: AC

## 2020-01-10 MED ORDER — OXYCODONE HCL 10 MG PO TABS
10.0000 mg | ORAL_TABLET | Freq: Four times a day (QID) | ORAL | 0 refills | Status: AC | PRN
Start: 2020-01-10 — End: 2020-01-15

## 2020-01-10 MED ORDER — ADULT MULTIVITAMIN W/MINERALS CH: 1.0000 | ORAL_TABLET | Freq: Every day | ORAL | 0 refills | Status: AC

## 2020-01-10 MED ORDER — THIAMINE HCL 100 MG PO TABS: 100.0000 mg | ORAL_TABLET | Freq: Every day | ORAL | 0 refills | Status: AC

## 2020-01-10 MED ORDER — AMLODIPINE BESYLATE 10 MG PO TABS: 10.0000 mg | ORAL_TABLET | Freq: Every day | ORAL | 0 refills | Status: DC

## 2020-01-10 MED ORDER — PREDNISONE 20 MG PO TABS: ORAL_TABLET | ORAL | 0 refills | Status: AC

## 2020-01-10 MED FILL — oxyCODONE HCL 10 MG TABS: 10 | 5 days supply | Qty: 20 | Fill #0

## 2020-01-10 MED FILL — ELIQUIS 5 MG TABLET: 5 | 28 days supply | Qty: 70 | Fill #0

## 2020-01-10 MED FILL — FOLIC ACID 1 MG TABS: 1 | 30 days supply | Qty: 30 | Fill #0

## 2020-01-10 MED FILL — ALBUTEROL SULFATE HFA 108 (: 108 (90 BAS | 15 days supply | Qty: 18 | Fill #0

## 2020-01-10 MED FILL — NICOTINE 14 MG/24HR PATCH: 14 | 28 days supply | Qty: 28 | Fill #0

## 2020-01-10 MED FILL — ACETAMINOPHEN 500MG XT STRE: 500 | 10 days supply | Qty: 30 | Fill #0

## 2020-01-10 MED FILL — LIDOCAINE PATCH 5%: 5 | 30 days supply | Qty: 30 | Fill #0

## 2020-01-10 MED FILL — CYCLOBENZAPRINE HCL 5 MG TA: 5 | 10 days supply | Qty: 30 | Fill #0

## 2020-01-10 MED FILL — AMLODIPINE BESYLATE 10 MG T: 10 | 30 days supply | Qty: 30 | Fill #0

## 2020-01-10 MED FILL — VITAMIN B-1 100 MG TABS: 100 | 30 days supply | Qty: 30 | Fill #0

## 2020-01-10 NOTE — Plan of Care (Signed)

## 2020-01-10 NOTE — Progress Notes (Signed)
Pt. discharged in stable condition. Walked to front door with nurse. Left via car with family. AVS documentation given attempted to review documentation with Pt.., Pt. Stated, "Don't worry about it, I just want to go, I want to get out of this place!" Pt. Requested PRN pain meds before leaving, given. Meds from pharmacy provided to PT. Work letter provided to [employer]. Belongings sent with Pt. IV removed with tip intact.

## 2020-01-10 NOTE — Discharge Summary (Signed)
Physician Discharge Summary  Ann Mcmillan HYQ:[id number] DOB: 04/12/1986 DOA: 01/03/2020  PCP: Patient, No Pcp Per  Admit date: 01/03/2020 Discharge date: 01/10/2020  Time spent: 40 minutes  Recommendations for Outpatient Follow-up:  1. Follow outpatient CBC/CMP 2. Quarantine per CDC guidelines - until Oct 7th 3. Continue anticoagulation x 3 months uninterrupted for provoked VTE, follow up with PCP for long term anticoagulation plans (can likely discontinue at this time in setting of provoked VTE) 4. Encourage smoking cessation, especially in setting of VTE above 5. Continue pain management for pulm infarction  6. Establish with PCP  Discharge Diagnoses:  Principal Problem:   Pulmonary embolism associated with COVID-19 (HCC) Active Problems:   Tobacco dependence   Hypokalemia   Chest pain   Discharge Condition: stable  Diet recommendation: heart healthy  Filed Weights   01/04/20 1015  Weight: 72.6 kg    History of present illness:  Patient is Ann Mcmillan 34 y.o. female with PMHx of bronchial asthma-tested positive for COVID-19 on 9/7-presented with several days history of shortness of breath, chest pain-found to have pulmonary embolism in the setting of COVID-19 pneumonia-subsequently admitted to the hospitalist service.  See below for additional details  Hospital Course:  Acute Hypoxic Respiratory Failure 2/2 Pulmonary Embolism  Pulm Infarction: Transitioned to eliquis, needs uninterrupted anticoagulation x3 months with provoked PE in setting of COVID 19 infection - will need to follow outpatient  For pleuritic pain from pulm infarct as well as msk pain from coughing spells, she'll d/c with oxycodone, lidocaine patch, flexeril, apap. Echo with normal RVSF and normal EF (see report) LE [place] negative for DVT  COVID 19 pneumonia S/p remdesivir, steroid taper satting 100% on RA  COVID-19 Labs  Recent Labs    01/08/20 1415 01/09/20 0617 01/10/20 1111  DDIMER 1.41* 1.65* 1.22*   FERRITIN 102 88 82  CRP 3.4* 3.6* 2.0*    No results found for: SARSCOV2NAA  Elevated LFT's Likely 2/2 covid, follow outpatient  Hypertension Started on amlodipine, follow outpatient  Etoh Abuse Follow outpatient, encourage cessation  Procedures: Echo IMPRESSIONS    1. Left ventricular ejection fraction, by estimation, is 55 to 60%. The  left ventricle has normal function. The left ventricle has no regional  wall motion abnormalities. Left ventricular diastolic parameters were  normal.  2. Right ventricular systolic function is normal. The right ventricular  size is normal. Tricuspid regurgitation signal is inadequate for assessing  PA pressure.  3. The mitral valve is grossly normal. Trivial mitral valve  regurgitation. No evidence of mitral stenosis.  4. The aortic valve is tricuspid. Aortic valve regurgitation is not  visualized. No aortic stenosis is present.  5. The inferior vena cava is normal in size with greater than 50%  respiratory variability, suggesting right atrial pressure of 3 mmHg.   Conclusion(s)/Recommendation(s): Normal biventricular function without  evidence of hemodynamically significant valvular heart disease.  LE [place] Summary:  RIGHT:  - There is no evidence of deep vein thrombosis in the lower extremity.    - No cystic structure found in the popliteal fossa.    LEFT:  - There is no evidence of deep vein thrombosis in the lower extremity.    - No cystic structure found in the popliteal fossa.   Consultations: none  Discharge Exam: Vitals:   01/10/20 0902 01/10/20 0903  BP: 127/87 127/87  Pulse:  83  Resp:    Temp:  98 F (36.7 C)  SpO2:     C/o continued L sided chest pain  with palpation and respirations Asking what she needs to do to get better  General: No acute distress. Cardiovascular: Heart sounds show Ann Mcmillan regular rate, and rhythm.  TTP to L side and L chest - no visible lesions Lungs: Clear to auscultation  bilaterally  Abdomen: Soft, nontender, nondistended  Neurological: Alert and oriented 3. Moves all extremities 4 . Cranial nerves II through XII grossly intact. Skin: Warm and dry. No rashes or lesions. Extremities: No clubbing or cyanosis. No edema.   Discharge Instructions   Discharge Instructions    Diet - low sodium heart healthy   Complete by: As directed    Discharge instructions   Complete by: As directed    You were seen for Ann Mcmillan COVID 19 virus infection as well as pulmonary embolisms (blood clots in the lungs).  For your pulmonary embolism, you're being treated with eliquis.  Continue this twice daily.  You'll take 10 mg twice daily for another 5 doses, then you'll start 5 mg twice daily.  You should continue your antiocoagulation (blood thinning medicines) for 3 months uninterrupted.  After this, you should discuss discontinuing your blood thinner with your PCP (with Ann Mcmillan provoking factor of your covid infection, you may be able to discontinue your antiocoagulation after 3 months).  Since you're on Ann Mcmillan blood thinner, avoid aspirin and NSAIDs unless directed to take these by your PCP.  Your pain is likely from your pulmonary infarction.  You should gradually improve.  We'll send you home with oxycodone to use in addition to flexeril (muscle relaxer) and lidocaine patch.  Continue to use tylenol as needed for the pain as well.  You may need physical therapy outpatient if you pain continues.  Continue your steroid taper as prescribed.  You should quarantined for 21 days since your positive covid test.  You can discontinue isolation on October 7th as long as you've continued to improve without the need for fever reducing medicines.  Return for new, recurrent, or worsening symptoms.  Please ask your PCP to request records from this hospitalization so they know what was done and what the next steps will be.   Increase activity slowly   Complete by: As directed      Allergies as of  01/10/2020   No Known Allergies     Medication List    STOP taking these medications   ibuprofen 200 MG tablet Commonly known as: ADVIL   Motrin PM 200-38 MG Tabs Generic drug: Ibuprofen-diphenhydrAMINE Cit     TAKE these medications   acetaminophen 500 MG tablet Commonly known as: TYLENOL Take 2 tablets (1,000 mg total) by mouth every 8 (eight) hours as needed.   albuterol 108 (90 Base) MCG/ACT inhaler Commonly known as: VENTOLIN HFA Inhale 2 puffs into the lungs every 4 (four) hours as needed for wheezing or shortness of breath.   amLODipine 10 MG tablet Commonly known as: NORVASC Take 1 tablet (10 mg total) by mouth daily. Start taking on: January 11, 2020   apixaban 5 MG Tabs tablet Commonly known as: ELIQUIS Take 2 tablets (10 mg total) by mouth 2 (two) times daily for 5 doses. (then start 5 mg twice daily)   apixaban 5 MG Tabs tablet Commonly known as: ELIQUIS Take 1 tablet (5 mg total) by mouth 2 (two) times daily. (after completing 10 mg twice daily regimen) Start taking on: January 13, 2020   cyclobenzaprine 5 MG tablet Commonly known as: FLEXERIL Take 1 tablet (5 mg total) by mouth 3 (three) times  daily as needed for muscle spasms.   folic acid 1 MG tablet Commonly known as: FOLVITE Take 1 tablet (1 mg total) by mouth daily. Start taking on: January 11, 2020   lidocaine 5 % Commonly known as: LIDODERM Place 1 patch onto the skin daily at 6 PM. Remove & Discard patch within 12 hours or as directed by MD   multivitamin with minerals Tabs tablet Take 1 tablet by mouth daily. Start taking on: January 11, 2020   nicotine 14 mg/24hr patch Commonly known as: NICODERM CQ - dosed in mg/24 hours Place 1 patch (14 mg total) onto the skin daily. Start taking on: January 11, 2020   Oxycodone HCl 10 MG Tabs Take 1 tablet (10 mg total) by mouth every 6 (six) hours as needed for up to 5 days for moderate pain.   predniSONE 20 MG tablet Commonly known  as: DELTASONE Take 2 tablets (40 mg total) by mouth daily for 1 day, THEN 1.5 tablets (30 mg total) daily for 1 day, THEN 1 tablet (20 mg total) daily for 1 day, THEN 0.5 tablets (10 mg total) daily for 1 day. Start taking on: January 11, 2020   [facility] COLD & COUGH PO Take 1 Dose by mouth as needed (cold and cough). Powder mixed with water; tea   thiamine 100 MG tablet Take 1 tablet (100 mg total) by mouth daily. Start taking on: January 11, 2020      No Known Allergies  Follow-up Information    [facility] Follow up.   Why: You are scheduled an appointment for Monday Oct.18, 2021 at 9:30 with Dr. Laural Mcmillan. If you are unable to make this appointment please call the number listed above to reschedule.   Contact information: [address] [place] [address] [phone]               The results of significant diagnostics from this hospitalization (including imaging, microbiology, ancillary and laboratory) are listed below for reference.    Significant Diagnostic Studies: CT Angio Chest PE W/Cm &/Or Wo Cm  Result Date: 01/04/2020 CLINICAL DATA:  High probability for pulmonary embolism EXAM: CT ANGIOGRAPHY CHEST WITH CONTRAST TECHNIQUE: Multidetector CT imaging of the chest was performed using the standard protocol during bolus administration of intravenous contrast. Multiplanar CT image reconstructions and MIPs were obtained to evaluate the vascular anatomy. CONTRAST:  5mL OMNIPAQUE IOHEXOL 350 MG/ML SOLN COMPARISON:  None. FINDINGS: Cardiovascular: Bilateral lobar pulmonary emboli, involving all lobes on the left and involving the lower lobe on the right. Segmental clot is seen at the right upper lobe anteriorly. Basilar segment infarct posteriorly on the left with small reactive appearing effusion. Negative for failure. Normal RV to LV ratio. No pericardial effusion. Negative aorta. Mediastinum/Nodes: Negative for adenopathy  or mass Lungs/Pleura: Ground-glass opacity in Ann Mcmillan segmental distribution at the left base with trace effusion. Upper Abdomen: Negative Musculoskeletal: Negative Review of the MIP images confirms the above findings. Critical Value/emergent results were called by telephone at the time of interpretation on 01/04/2020 at 10:41 am to provider [facility] , who verbally acknowledged these results. IMPRESSION: Acute lobar and segmental pulmonary emboli on both sides with occlusive clot and infarct in the left lower lobe. No right heart dilatation. Electronically Signed   By: Marnee Spring M.D.   On: 01/04/2020 10:43   DG Chest Portable 1 View  Result Date: 01/03/2020 CLINICAL DATA:  Shortness of breath, COVID-19 positive 12/30/2019 EXAM: PORTABLE CHEST 1  VIEW COMPARISON:  None. FINDINGS: No consolidation, features of edema, pneumothorax, or effusion. Pulmonary vascularity is normally distributed. The cardiomediastinal contours are unremarkable. No acute osseous or soft tissue abnormality. Small radiodensity projecting over the mid chest, possibly external to the patient. IMPRESSION: No acute cardiopulmonary abnormality. Tiny metallic rounded density projecting over midline chest, likely external, correlate with visual inspection. Electronically Signed   By: Kreg Shropshire M.D.   On: 01/03/2020 22:15   ECHOCARDIOGRAM COMPLETE  Result Date: 01/05/2020    ECHOCARDIOGRAM REPORT   Patient Name:   LAVONN MAXCY Date of Exam: 01/05/2020 Medical Rec #:  [id number]   Height:       62.0 in Accession #:    [id number]  Weight:       160.0 lb Date of Birth:  1986/01/11   BSA:          1.739 m Patient Age:    34 years    BP:           167/103 mmHg Patient Gender: F           HR:           86 bpm. Exam Location:  Inpatient Procedure: 2D Echo Indications:    pulmonary embolus 415.19  History:        Patient has no prior history of Echocardiogram examinations.                 Risk Factors:Current Smoker. Covid + . pulmonary emboli.   Sonographer:    Celene Skeen RDCS (AE) Referring Phys: [id number] Dewayne Shorter M GHIMIRE IMPRESSIONS  1. Left ventricular ejection fraction, by estimation, is 55 to 60%. The left ventricle has normal function. The left ventricle has no regional wall motion abnormalities. Left ventricular diastolic parameters were normal.  2. Right ventricular systolic function is normal. The right ventricular size is normal. Tricuspid regurgitation signal is inadequate for assessing PA pressure.  3. The mitral valve is grossly normal. Trivial mitral valve regurgitation. No evidence of mitral stenosis.  4. The aortic valve is tricuspid. Aortic valve regurgitation is not visualized. No aortic stenosis is present.  5. The inferior vena cava is normal in size with greater than 50% respiratory variability, suggesting right atrial pressure of 3 mmHg. Conclusion(s)/Recommendation(s): Normal biventricular function without evidence of hemodynamically significant valvular heart disease. FINDINGS  Left Ventricle: Left ventricular ejection fraction, by estimation, is 55 to 60%. The left ventricle has normal function. The left ventricle has no regional wall motion abnormalities. The left ventricular internal cavity size was normal in size. There is  no left ventricular hypertrophy. Left ventricular diastolic parameters were normal. Right Ventricle: The right ventricular size is normal. No increase in right ventricular wall thickness. Right ventricular systolic function is normal. Tricuspid regurgitation signal is inadequate for assessing PA pressure. Left Atrium: Left atrial size was normal in size. Right Atrium: Right atrial size was normal in size. Pericardium: Trivial pericardial effusion is present. Mitral Valve: The mitral valve is grossly normal. Trivial mitral valve regurgitation. No evidence of mitral valve stenosis. Tricuspid Valve: The tricuspid valve is grossly normal. Tricuspid valve regurgitation is trivial. No evidence of tricuspid stenosis.  Aortic Valve: The aortic valve is tricuspid. Aortic valve regurgitation is not visualized. No aortic stenosis is present. Pulmonic Valve: The pulmonic valve was grossly normal. Pulmonic valve regurgitation is not visualized. No evidence of pulmonic stenosis. Aorta: The aortic root and ascending aorta are structurally normal, with no evidence of dilitation. Venous: The right upper pulmonary vein is  normal. The inferior vena cava is normal in size with greater than 50% respiratory variability, suggesting right atrial pressure of 3 mmHg. IAS/Shunts: The atrial septum is grossly normal.  LEFT VENTRICLE PLAX 2D LVIDd:         5.33 cm  Diastology LVIDs:         3.91 cm  LV e' medial:    6.42 cm/s LV PW:         0.90 cm  LV E/e' medial:  12.6 LV IVS:        0.73 cm  LV e' lateral:   9.36 cm/s LVOT diam:     2.10 cm  LV E/e' lateral: 8.6 LV SV:         61 LV SV Index:   35 LVOT Area:     3.46 cm  RIGHT VENTRICLE RV S prime:     13.10 cm/s TAPSE (M-mode): 3.1 cm LEFT ATRIUM             Index       RIGHT ATRIUM           Index LA diam:        3.30 cm 1.90 cm/m  RA Area:     16.10 cm LA Vol (A2C):   35.6 ml 20.47 ml/m RA Volume:   41.20 ml  23.70 ml/m LA Vol (A4C):   22.0 ml 12.65 ml/m LA Biplane Vol: 28.6 ml 16.45 ml/m  AORTIC VALVE LVOT Vmax:   91.40 cm/s LVOT Vmean:  61.100 cm/s LVOT VTI:    0.177 m  AORTA Ao Root diam: 2.80 cm MITRAL VALVE MV Area (PHT): 2.56 cm    SHUNTS MV Decel Time: 296 msec    Systemic VTI:  0.18 m MV E velocity: 80.60 cm/s  Systemic Diam: 2.10 cm MV Ann Mcmillan velocity: 90.40 cm/s MV E/Ann Mcmillan ratio:  0.89 Ann Odor MD Electronically signed by Ann Odor MD Signature Date/Time: 01/05/2020/2:59:24 PM    Final    VAS [place] LOWER EXTREMITY VENOUS (DVT)  Result Date: 01/05/2020  Lower Venous DVTStudy Indications: Pain, Swelling, and + Covid.  Risk Factors: None identified. Performing Technologist: Jannet Askew RCT RDMS  Examination Guidelines: Ann Mcmillan complete evaluation includes B-mode imaging, spectral  Doppler, color Doppler, and power Doppler as needed of all accessible portions of each vessel. Bilateral testing is considered an integral part of Ann Mcmillan complete examination. Limited examinations for reoccurring indications may be performed as noted. The reflux portion of the exam is performed with the patient in reverse Trendelenburg.  +---------+---------------+---------+-----------+----------+--------------+ RIGHT    CompressibilityPhasicitySpontaneityPropertiesThrombus Aging +---------+---------------+---------+-----------+----------+--------------+ CFV      Full           Yes      Yes                                 +---------+---------------+---------+-----------+----------+--------------+ SFJ      Full                                                        +---------+---------------+---------+-----------+----------+--------------+ FV Prox  Full                                                        +---------+---------------+---------+-----------+----------+--------------+  FV Mid   Full                                                        +---------+---------------+---------+-----------+----------+--------------+ FV DistalFull                                                        +---------+---------------+---------+-----------+----------+--------------+ PFV      Full                                                        +---------+---------------+---------+-----------+----------+--------------+ POP      Full           Yes      Yes                                 +---------+---------------+---------+-----------+----------+--------------+ PTV      Full                                                        +---------+---------------+---------+-----------+----------+--------------+ PERO     Full                                                        +---------+---------------+---------+-----------+----------+--------------+    +---------+---------------+---------+-----------+----------+--------------+ LEFT     CompressibilityPhasicitySpontaneityPropertiesThrombus Aging +---------+---------------+---------+-----------+----------+--------------+ CFV      Full           Yes      Yes                                 +---------+---------------+---------+-----------+----------+--------------+ SFJ      Full                                                        +---------+---------------+---------+-----------+----------+--------------+ FV Prox  Full                                                        +---------+---------------+---------+-----------+----------+--------------+ FV Mid   Full                                                        +---------+---------------+---------+-----------+----------+--------------+   FV DistalFull                                                        +---------+---------------+---------+-----------+----------+--------------+ PFV      Full                                                        +---------+---------------+---------+-----------+----------+--------------+ POP      Full           Yes      Yes                                 +---------+---------------+---------+-----------+----------+--------------+ PTV      Full                                                        +---------+---------------+---------+-----------+----------+--------------+ PERO     Full                                                        +---------+---------------+---------+-----------+----------+--------------+     Summary: RIGHT: - There is no evidence of deep vein thrombosis in the lower extremity.  - No cystic structure found in the popliteal fossa.  LEFT: - There is no evidence of deep vein thrombosis in the lower extremity.  - No cystic structure found in the popliteal fossa.  *See table(s) above for measurements and observations. Electronically signed  by Ann Livings MD on 01/05/2020 at 12:04:19 PM.    Final     Microbiology: No results found for this or any previous visit (from the past 240 hour(s)).   Labs: Basic Metabolic Panel: Recent Labs  Lab 01/05/20 0039 01/05/20 0039 01/05/20 0954 01/05/20 0954 01/06/20 0140 01/07/20 1023 01/08/20 1415 01/09/20 0617 01/10/20 1111  NA 136   < > 133*   < > 138 138 136 136 136  K 3.9   < > 3.5   < > 3.8 3.4* 4.4 3.7 3.8  CL 102   < > 100   < > 101 101 100 98 96*  CO2 24   < > 25   < > 27 29 24 28 29   GLUCOSE 128*   < > 181*   < > 141* 153* 140* 105* 98  BUN 7   < > 6   < > 7 8 6 10 8   CREATININE 0.66   < > 0.64   < > 0.58 0.82 0.64 0.58 0.66  CALCIUM 9.0   < > 9.0   < > 9.1 9.0 9.0 8.9 9.1  MG 1.8   < > 1.8  --  2.0 1.9 2.0  --  1.9  PHOS 2.6  --  3.3  --  2.3* 2.4*  --   --   --    < > =  values in this interval not displayed.   Liver Function Tests: Recent Labs  Lab 01/06/20 0140 01/07/20 1023 01/08/20 1415 01/09/20 0617 01/10/20 1111  AST 27 38 98* 82* 58*  ALT 26 29 76* 103* 103*  ALKPHOS 59 60 66 73 72  BILITOT 0.4 0.4 0.4 0.3 0.2*  PROT 7.5 7.4 7.3 6.7 7.3  ALBUMIN 3.0* 2.9* 3.0* 2.7* 3.0*   No results for input(s): LIPASE, AMYLASE in the last 168 hours. No results for input(s): AMMONIA in the last 168 hours. CBC: Recent Labs  Lab 01/06/20 0140 01/07/20 1023 01/08/20 1415 01/09/20 0617 01/10/20 0239  WBC 14.1* 10.3 13.2* 13.9* 16.7*  HGB 13.9 14.3 14.3 12.9 13.6  HCT 40.6 42.5 42.2 38.8 40.9  MCV 92.9 93.6 93.6 93.7 92.7  PLT 354 363 378 360 321   Cardiac Enzymes: No results for input(s): CKTOTAL, CKMB, CKMBINDEX, TROPONINI in the last 168 hours. BNP: BNP (last 3 results) No results for input(s): BNP in the last 8760 hours.  ProBNP (last 3 results) No results for input(s): PROBNP in the last 8760 hours.  CBG: Recent Labs  Lab 01/07/20 1633 01/07/20 2125 01/08/20 0755 01/08/20 2020 01/09/20 2120  GLUCAP 95 90 86 131* 118*        Signed:  Lacretia Nicks MD.  [facility] 01/10/2020, 3:03 PM

## 2020-01-10 NOTE — Evaluation (Signed)
Physical Therapy Evaluation Patient Details Name: Ann Mcmillan MRN: [id number] DOB: October 31, 1985 Today's Date: 01/10/2020   History of Present Illness  Pt is 34 yo female with PMH of asthma.  She tested + for COVID on 12/26/19.  Pt presented with [facility] and chest pain and was found to have PE in the setting of COVID.  Pt has received anticoagulants and PT has been asked to evaluate pt.  Clinical Impression  Pt admitted with COVID with PE.  She presented with decreased endurance and L shoulder/flank/chest pain.  However, pt was able to perform ADLs, small task in room, and ambulation independently with VSS.  She required rest breaks and performed slowly but was able to complete these task.  Pt verbalized understanding of recommended exercises, importance of maintaining mobility with rest breaks, and energy conservation.  Pt did not feel she needed further PT at this time for mobility or L sided pain - states "I just want to go home."  PT agrees , no further acute PT indicated. Recommended outpt PT if now improved in a couple of weeks.     Follow Up Recommendations Outpatient PT (possible outpt PT in 2 weeks if L shoulder/chest/flank pain not improved)    Equipment Recommendations  None recommended by PT    Recommendations for Other Services       Precautions / Restrictions Precautions Precautions: None      Mobility  Bed Mobility Overal bed mobility: Independent             General bed mobility comments: Pt has been ambulating in room and making bed and performing ADLs without assistance - reports just takes increased time and rest breaks  Transfers Overall transfer level: Independent                  Ambulation/Gait Ambulation/Gait assistance: Supervision Gait Distance (Feet): 200 Feet Assistive device: None       General Gait Details: decreased speed and DOE of 1/4 otherwise [facility]  Stairs            Wheelchair Mobility    Modified Rankin (Stroke Patients  Only)       Balance Overall balance assessment: Independent                                           Pertinent Vitals/Pain Pain Assessment: 0-10 Pain Score: 8  Pain Location: chest - reports with coughing, activity, and talking Pain Intervention(s): Limited activity within patient's tolerance;Monitored during session    Home Living Family/patient expects to be discharged to:: Private residence Living Arrangements: Alone Available Help at Discharge: Family (mom is staying with her for a bit) Type of Home: Apartment Home Access: Stairs to enter Entrance Stairs-Rails: Right Entrance Stairs-Number of Steps: flight Home Layout: One level Home Equipment: None      Prior Function Level of Independence: Independent         Comments: works as Social research officer, 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  States has been going on with COVID/PE.  Expresses that pain increases with coughing, activity, and L UE movement.  Pt tended to hike  L shoulder with shoulder flexion/abduction AROM and PROM - reports due to tightness.  Pt expressing she understands this is likely just due to COVID and PE and that she needs time. Pt held L chest/underarm frequently throughotu session. Encouraged ROM and some times of resting with hand overhead to decrease risk of frozen shoulder.  Pt verbalized understanding and she felt did not need further assesment at this time.  Verbalized could follow-up with outpt PT if pain not improved in 1-2 weeks.  Also, advised trying ice pack or heating pad at home for pain relief.    Lower Extremity Assessment Lower Extremity Assessment: Overall WFL for tasks assessed    Cervical / Trunk  Assessment Cervical / Trunk Assessment: Normal  Communication      Cognition Arousal/Alertness: Awake/alert Behavior During Therapy: WFL for tasks assessed/performed Overall Cognitive Status: Within Functional Limits for tasks assessed                                        General Comments General comments (skin integrity, edema, etc.): VSS; educated on energy conservation and L shoulder ROM (see extremities section); discussed stairs and pt felt she could perform slowly (demonstrates necessary balance and ROM)    Exercises     Assessment/Plan    PT Assessment Patent does not need any further PT services  PT Problem List         PT Treatment Interventions      PT Goals (Current goals can be found in the Care Plan section)  Acute Rehab PT Goals Patient Stated Goal: decrease pain ; get home ASAP PT Goal Formulation: All assessment and education complete, DC therapy Potential to Achieve Goals: Good    Frequency     Barriers to discharge        Co-evaluation               AM-PAC PT "6 Clicks" Mobility  Outcome Measure Help needed turning from your back to your side while in a flat bed without using bedrails?: None Help needed moving from lying on your back to sitting on the side of a flat bed without using bedrails?: None Help needed moving to and from a bed to a chair (including a wheelchair)?: None Help needed standing up from a chair using your arms (e.g., wheelchair or bedside chair)?: None Help needed to walk in hospital room?: None Help needed climbing 3-5 steps with a railing? : None 6 Click Score: 24    End of Session   Activity Tolerance: Patient tolerated treatment well Patient left: in bed;with call bell/phone within reach Nurse Communication: Mobility status      Time: 1050-1130 PT Time Calculation (min) (ACUTE ONLY): 40 min   Charges:   PT Evaluation $PT Eval Moderate Complexity: 1 Mod          Ann Mcmillan, PT Acute Rehab  Services Pager [phone] [facility] [phone]    Ann Mcmillan 01/10/2020, 11:44 AM

## 2020-01-14 ENCOUNTER — Other Ambulatory Visit: Payer: Self-pay

## 2020-01-14 ENCOUNTER — Emergency Department (HOSPITAL_COMMUNITY): Payer: Medicaid Other

## 2020-01-14 ENCOUNTER — Emergency Department (HOSPITAL_COMMUNITY)
Admission: EM | Admit: 2020-01-14 | Discharge: 2020-01-15 | Disposition: A | Discharge status: Home / Self Care | Payer: Medicaid Other | Attending: Emergency Medicine | Admitting: Emergency Medicine

## 2020-01-14 ENCOUNTER — Encounter (HOSPITAL_COMMUNITY): Payer: Self-pay

## 2020-01-14 DIAGNOSIS — Z7952 Long term (current) use of systemic steroids: Secondary | ICD-10-CM | POA: Insufficient documentation

## 2020-01-14 DIAGNOSIS — R000 Tachycardia, unspecified: Secondary | ICD-10-CM | POA: Insufficient documentation

## 2020-01-14 DIAGNOSIS — J45909 Unspecified asthma, uncomplicated: Secondary | ICD-10-CM | POA: Insufficient documentation

## 2020-01-14 DIAGNOSIS — F17290 Nicotine dependence, other tobacco product, uncomplicated: Secondary | ICD-10-CM | POA: Diagnosis not present

## 2020-01-14 DIAGNOSIS — Z7901 Long term (current) use of anticoagulants: Secondary | ICD-10-CM | POA: Diagnosis not present

## 2020-01-14 DIAGNOSIS — R079 Chest pain, unspecified: Secondary | ICD-10-CM

## 2020-01-14 DIAGNOSIS — Z8616 Personal history of COVID-19: Secondary | ICD-10-CM | POA: Insufficient documentation

## 2020-01-14 DIAGNOSIS — J181 Lobar pneumonia, unspecified organism: Secondary | ICD-10-CM | POA: Insufficient documentation

## 2020-01-14 DIAGNOSIS — R0789 Other chest pain: Secondary | ICD-10-CM | POA: Diagnosis present

## 2020-01-14 DIAGNOSIS — J189 Pneumonia, unspecified organism: Secondary | ICD-10-CM

## 2020-01-14 LAB — BASIC METABOLIC PANEL
Anion gap: 10 (ref 5–15)
BUN: 7 mg/dL (ref 6–20)
CO2: 26 mmol/L (ref 22–32)
Calcium: 9.4 mg/dL (ref 8.9–10.3)
Chloride: 96 mmol/L — ABNORMAL LOW (ref 98–111)
Creatinine, Ser: 0.62 mg/dL (ref 0.44–1.00)
GFR calc Af Amer: >60 mL/min (ref >60)
GFR calc non Af Amer: >60 mL/min (ref >60)
Glucose, Bld: 97 mg/dL (ref 70–99)
Potassium: 4.2 mmol/L (ref 3.5–5.1)
Sodium: 132 mmol/L — ABNORMAL LOW (ref 135–145)

## 2020-01-14 LAB — PROTIME-INR
INR: 1.3 — ABNORMAL HIGH (ref 0.8–1.2)
Prothrombin Time: 15.5 seconds — ABNORMAL HIGH (ref 11.4–15.2)

## 2020-01-14 LAB — I-STAT BETA HCG BLOOD, ED (MC, WL, AP ONLY): I-stat hCG, quantitative: <5 m[IU]/mL (ref <5)

## 2020-01-14 LAB — HEPATIC FUNCTION PANEL
ALT: 121 U/L — ABNORMAL HIGH (ref 0–44)
AST: 76 U/L — ABNORMAL HIGH (ref 15–41)
Albumin: 3.5 g/dL (ref 3.5–5.0)
Alkaline Phosphatase: 121 U/L (ref 38–126)
Bilirubin, Direct: 0.2 mg/dL (ref 0.0–0.2)
Indirect Bilirubin: 0.5 mg/dL (ref 0.3–0.9)
Total Bilirubin: 0.7 mg/dL (ref 0.3–1.2)
Total Protein: 8.5 g/dL — ABNORMAL HIGH (ref 6.5–8.1)

## 2020-01-14 LAB — CBC
HCT: 43.7 % (ref 36.0–46.0)
Hemoglobin: 14.6 g/dL (ref 12.0–15.0)
MCH: 30.9 pg (ref 26.0–34.0)
MCHC: 33.4 g/dL (ref 30.0–36.0)
MCV: 92.6 fL (ref 80.0–100.0)
Platelets: 317 10*3/uL (ref 150–400)
RBC: 4.72 MIL/uL (ref 3.87–5.11)
RDW: 12.2 % (ref 11.5–15.5)
WBC: 12.7 10*3/uL — ABNORMAL HIGH (ref 4.0–10.5)
nRBC: 0 % (ref 0.0–0.2)

## 2020-01-14 LAB — TROPONIN I (HIGH SENSITIVITY): Troponin I (High Sensitivity): 3 ng/L (ref <18)

## 2020-01-14 LAB — BRAIN NATRIURETIC PEPTIDE: B Natriuretic Peptide: 44.9 pg/mL (ref 0.0–100.0)

## 2020-01-14 MED ORDER — HYDROMORPHONE HCL 1 MG/ML IJ SOLN
1.0000 mg | Freq: Once | INTRAMUSCULAR | Status: AC
  Administered 2020-01-14: 1 mg via INTRAVENOUS
  Filled 2020-01-14: qty 1

## 2020-01-14 MED ORDER — SODIUM CHLORIDE 0.9 % IV BOLUS
500.0000 mL | Freq: Once | INTRAVENOUS | Status: AC
  Administered 2020-01-14: 500 mL via INTRAVENOUS

## 2020-01-14 MED ORDER — IOHEXOL 350 MG/ML SOLN
100.0000 mL | Freq: Once | INTRAVENOUS | Status: AC | PRN
  Administered 2020-01-14: 100 mL via INTRAVENOUS

## 2020-01-14 MED ORDER — SODIUM CHLORIDE (PF) 0.9 % IJ SOLN
INTRAMUSCULAR | Status: AC
  Filled 2020-01-14: qty 50

## 2020-01-14 NOTE — ED Triage Notes (Signed)
Patient presents to the ER for chest pain. Patient has many known blood clots from previous admission. Patient reports severe chest pain

## 2020-01-14 NOTE — ED Provider Notes (Addendum)
[facility]-EMERGENCY DEPT Provider Note   CSN: [id number] Arrival date & time: 01/14/20  1944     History Chief Complaint  Patient presents with  . Chest Pain    Ann Mcmillan is a 34 y.o. female with a history of recent diagnosis of multiple bilateral pulmonary embolisms, now on Eliquis, presenting to the emergency department with worsening left-sided chest pain.  Patient was discharged from our hospital 4 days ago on September 26 after being hospitalized with pulmonary embolism in the setting of Covid diagnosis approximately 3 weeks ago.  She was started on heparin, transition to Eliquis, and was felt that she needed 3 months of this for provoked PE in the setting of COVID-19 infection.  She was suffering from significant pleuritic pain from a pulmonary infarct, as well as muscular pain from coughing.  She was discharged with lidocaine patch, Flexeril, extra strength Tylenol, oxycodone.  She was satting 100% on room air at the time of discharge.  She reports been taking all of her medications, but she feels that her pain was never adequately controlled at the time of discharge.  She does report that in the past 24 hours, she feels like she has a new and worsening pain the left side of her chest.  She says her pain hurts anytime she moves, it hurts with coughing and breathing.  The pain is sharp, wraps around her left breast, and is 10/10 in intensity.  It is similar to the pain she has had since her diagnosis of PE.  She does report compliance with her Eliquis.  She took both of her daily doses today, including this evening.  She has not missed any doses.  DVT ultrasound was negative in last hospitalization.  CT on 01/04/20 showed: IMPRESSION: Acute lobar and segmental pulmonary emboli on both sides with occlusive clot and infarct in the left lower lobe. No right heart Dilatation.  Also ground-glass opacity in trace effusion in left base.  HPI     Past Medical  History:  Diagnosis Date  . Asthma   . COVID-19   . Pulmonary emboli (HCC) 01/04/2020    Patient Active Problem List   Diagnosis Date Noted  . Chest pain   . Pulmonary embolism associated with COVID-19 (HCC) 01/04/2020  . Tobacco dependence 01/04/2020  . Hypokalemia 01/04/2020    Past Surgical History:  Procedure Laterality Date  . TOOTH EXTRACTION       OB History   No obstetric history on file.     No family history on file.  Social History   Tobacco Use  . Smoking status: Current Every Day Smoker    Types: Cigars  . Smokeless tobacco: Never Used  Substance Use Topics  . Alcohol use: Yes  . Drug use: Not Currently    Types: Marijuana    Comment: denies    Home Medications Prior to Admission medications   Medication Sig Start Date End Date Taking? Authorizing Provider  acetaminophen (TYLENOL) 500 MG tablet Take 2 tablets (1,000 mg total) by mouth every 8 (eight) hours as needed. Patient taking differently: Take 1,000 mg by mouth every 8 (eight) hours as needed for headache (pain).  01/10/20  Yes Zigmund Daniel., MD  albuterol (VENTOLIN HFA) 108 (90 Base) MCG/ACT inhaler Inhale 2 puffs into the lungs every 4 (four) hours as needed for wheezing or shortness of breath. 01/10/20 02/09/20 Yes Zigmund Daniel., MD  apixaban (ELIQUIS) 5 MG TABS tablet Take 2 tablets (10  mg total) by mouth 2 (two) times daily for 5 doses. (then start 5 mg twice daily) Patient taking differently: Take 5-10 mg by mouth See admin instructions. Take 2 tablets (10 mg) by mouth twice daily for 5 doses and then take 1 tablet (5 mg) twice daily. 01/10/20 03/14/20 Yes Zigmund Daniel., MD  cyclobenzaprine (FLEXERIL) 5 MG tablet Take 1 tablet (5 mg total) by mouth 3 (three) times daily as needed for muscle spasms. 01/10/20  Yes Zigmund Daniel., MD  folic acid (FOLVITE) 1 MG tablet Take 1 tablet (1 mg total) by mouth daily. 01/11/20 02/10/20 Yes Zigmund Daniel., MD    lidocaine (LIDODERM) 5 % Place 1 patch onto the skin daily at 6 PM. Remove & Discard patch within 12 hours or as directed by MD 01/10/20  Yes Zigmund Daniel., MD  Multiple Vitamin (MULTIVITAMIN WITH MINERALS) TABS tablet Take 1 tablet by mouth daily. 01/11/20 02/10/20 Yes Zigmund Daniel., MD  nicotine (NICODERM CQ - DOSED IN MG/24 HOURS) 14 mg/24hr patch Place 1 patch (14 mg total) onto the skin daily. 01/11/20 02/10/20 Yes Zigmund Daniel., MD  oxyCODONE 10 MG TABS Take 1 tablet (10 mg total) by mouth every 6 (six) hours as needed for up to 5 days for moderate pain. Patient taking differently: Take 10 mg by mouth every 6 (six) hours as needed (moderate pain).  01/10/20 01/15/20 Yes Zigmund Daniel., MD  Phenylephrine-Pheniramine-DM [facility] COLD & COUGH PO) Take 1 Dose by mouth 2 (two) times daily as needed (cold and cough). Powder mixed with water; tea    Yes [provider]  thiamine 100 MG tablet Take 1 tablet (100 mg total) by mouth daily. 01/11/20 02/10/20 Yes Zigmund Daniel., MD  amLODipine (NORVASC) 10 MG tablet Take 1 tablet (10 mg total) by mouth daily. 01/11/20 02/10/20  Zigmund Daniel., MD  docusate sodium (COLACE) 100 MG capsule Take 1 capsule (100 mg total) by mouth daily. 01/15/20 02/14/20  Terald Sleeper, MD  doxycycline (VIBRAMYCIN) 100 MG capsule Take 1 capsule (100 mg total) by mouth 2 (two) times daily for 9 days. 01/15/20 01/24/20  Terald Sleeper, MD  gabapentin (NEURONTIN) 300 MG capsule Take 1 capsule (300 mg total) by mouth 3 (three) times daily. 01/15/20 02/14/20  Terald Sleeper, MD  HYDROmorphone (DILAUDID) 2 MG tablet Take 1 tablet (2 mg total) by mouth every 6 (six) hours as needed for up to 12 doses for severe pain. 01/15/20   Terald Sleeper, MD  predniSONE (DELTASONE) 20 MG tablet Take 2 tablets (40 mg total) by mouth daily for 1 day, THEN 1.5 tablets (30 mg total) daily for 1 day, THEN 1 tablet (20 mg total) daily for 1  day, THEN 0.5 tablets (10 mg total) daily for 1 day. 01/11/20 01/15/20  Zigmund Daniel., MD  loratadine (CLARITIN) 10 MG tablet Take 1 tablet (10 mg total) by mouth daily. Patient not taking: Reported on 07/03/2017 07/28/16 05/03/19  [name], PA-C    Allergies    Patient has no known allergies.  Review of Systems   Review of Systems  Constitutional: Negative for chills and fever.  HENT: Negative for ear pain and sore throat.   Eyes: Negative for pain and visual disturbance.  Respiratory: Positive for shortness of breath. Negative for cough.   Cardiovascular: Positive for chest pain. Negative for palpitations.  Gastrointestinal: Positive for nausea. Negative for abdominal  pain and vomiting.  Genitourinary: Negative for dysuria and hematuria.  Musculoskeletal: Positive for arthralgias, back pain and myalgias.  Skin: Negative for color change and rash.  Neurological: Positive for dizziness and light-headedness. Negative for syncope.  Psychiatric/Behavioral: Negative for agitation and confusion.  All other systems reviewed and are negative.   Physical Exam Updated Vital Signs BP 102/69 (BP Location: Right Arm)   Pulse (!) 110   Temp 98.9 F (37.2 C) (Oral)   Resp (!) 21   Ht  (1.575 m)   Wt 72.6 kg   SpO2 95%   BMI 29.26 kg/m   Physical Exam Vitals and nursing note reviewed.  Constitutional:      General: She is not in acute distress.    Appearance: She is well-developed.  HENT:     Head: Normocephalic and atraumatic.  Eyes:     Conjunctiva/sclera: Conjunctivae normal.  Cardiovascular:     Rate and Rhythm: Regular rhythm. Tachycardia present.     Heart sounds: No murmur heard.   Pulmonary:     Effort: Pulmonary effort is normal. No respiratory distress.     Breath sounds: Normal breath sounds.     Comments: 100% on room air Abdominal:     Palpations: Abdomen is soft.     Tenderness: There is no abdominal tenderness.  Musculoskeletal:     Cervical  back: Neck supple.  Skin:    General: Skin is warm and dry.  Neurological:     General: No focal deficit present.     Mental Status: She is alert and oriented to person, place, and time.  Psychiatric:        Mood and Affect: Mood normal.        Behavior: Behavior normal.     ED Results / Procedures / Treatments   Labs (all labs ordered are listed, but only abnormal results are displayed) Labs Reviewed  BASIC METABOLIC PANEL - Abnormal; Notable for the following components:      Result Value   Sodium 132 (*)    Chloride 96 (*)    All other components within normal limits  CBC - Abnormal; Notable for the following components:   WBC 12.7 (*)    All other components within normal limits  PROTIME-INR - Abnormal; Notable for the following components:   Prothrombin Time 15.5 (*)    INR 1.3 (*)    All other components within normal limits  HEPATIC FUNCTION PANEL - Abnormal; Notable for the following components:   Total Protein 8.5 (*)    AST 76 (*)    ALT 121 (*)    All other components within normal limits  BRAIN NATRIURETIC PEPTIDE  I-STAT BETA HCG BLOOD, ED ([facility], [facility], [facility] ONLY)  TYPE AND SCREEN  TROPONIN I (HIGH SENSITIVITY)  TROPONIN I (HIGH SENSITIVITY)    EKG EKG Interpretation  Date/Time:  Sunday January 14 2020 20:02:08 EDT Ventricular Rate:  138 PR Interval:    QRS Duration: 79 QT Interval:  294 QTC Calculation: 446 R Axis:   63 Text Interpretation: Sinus tachycardia Ventricular premature complex Aberrant complex LAE, consider biatrial enlargement Probable LVH with secondary repol abnrm 12 Lead; Mason-Likar NO sig change from prior SEpt 2021 ecg, no STEMI Confirmed by Alvester Chou [phone]) on 01/14/2020 9:11:45 PM   Radiology DG Chest 2 View  Result Date: 01/14/2020 CLINICAL DATA:  Chest pain EXAM: CHEST - 2 VIEW COMPARISON:  01/03/2020 FINDINGS: There is a left-sided pleural effusion with adjacent airspace disease. This is  new since prior study. There is no  pneumothorax. The heart size is unremarkable. There is no acute osseous abnormality. The heart size is unremarkable. IMPRESSION: New left-sided pleural effusion with adjacent airspace disease favored to represent atelectasis or developing pulmonary infarcts in the setting of known pulmonary emboli. Electronically Signed   By: Katherine Mantle M.D.   On: 01/14/2020 20:48   CT Angio Chest PE W and/or Wo Contrast  Result Date: 01/14/2020 CLINICAL DATA:  Pulmonary embolus suspected with high probability. Diagnosed with multiple pulmonary emboli 2 weeks ago. Now with acute worsening of left-sided chest pain. EXAM: CT ANGIOGRAPHY CHEST WITH CONTRAST TECHNIQUE: Multidetector CT imaging of the chest was performed using the standard protocol during bolus administration of intravenous contrast. Multiplanar CT image reconstructions and MIPs were obtained to evaluate the vascular anatomy. CONTRAST:  OMNIPAQUE IOHEXOL 350 MG/ML SOLN COMPARISON:  01/04/2020 FINDINGS: Cardiovascular: Motion and streak artifact limits the technical quality of the examination. There is moderately good opacification of the central and segmental pulmonary arteries. Possible residual peripheral emboli in the left lung base although less prominent than on the prior study and difficult to see due to consolidation. Otherwise, no residual or recurrent filling defects are identified. Changes are consistent with good response to interval therapy. Normal heart size. No pericardial effusions. Normal caliber thoracic aorta. No aortic dissection. Mediastinum/Nodes: Esophagus is decompressed. No significant lymphadenopathy in the chest. Lungs/Pleura: Consolidation and atelectasis in the left lung base. This could represent pulmonary infarct or pneumonia. Centrally obstructing process is not excluded and follow-up to resolution is recommended. Moderate left pleural effusion. Consolidation and effusion are increased since previous study. Right lung is  clear. Upper Abdomen: No acute process demonstrated in the visualized upper abdomen. Musculoskeletal: No chest wall abnormality. No acute or significant osseous findings. Review of the MIP images confirms the above findings. IMPRESSION: 1. Motion and streak artifact limits the technical quality of the examination. Possible residual peripheral emboli in the left lung base although less prominent than on the prior study and difficult to see due to consolidation. Otherwise, no residual or recurrent filling defects are identified. Changes are consistent with good response to interval therapy. 2. Consolidation and atelectasis in the left lung base. This could represent pulmonary infarct or pneumonia. Centrally obstructing process is not excluded and follow-up to resolution is recommended. 3. Moderate left pleural effusion, increased since previous study. Electronically Signed   By: Burman Nieves M.D.   On: 01/14/2020 23:05    Procedures Procedures (including critical care time)  Medications Ordered in ED Medications  HYDROmorphone (DILAUDID) injection 1 mg (1 mg Intravenous Given 01/14/20 2334)  sodium chloride 0.9 % bolus 500 mL (0 mLs Intravenous Stopped 01/15/20 0028)  sodium chloride (PF) 0.9 % injection (  Given 01/14/20 2334)  iohexol (OMNIPAQUE) 350 MG/ML injection 100 mL (100 mLs Intravenous Contrast Given 01/14/20 2248)  cefTRIAXone (ROCEPHIN) 1 g in sodium chloride 0.9 % 100 mL IVPB (0 g Intravenous Stopped 01/15/20 0115)  doxycycline (VIBRA-TABS) tablet 100 mg (100 mg Oral Given 01/15/20 0047)  HYDROmorphone (DILAUDID) injection 1 mg (1 mg Intravenous Given 01/15/20 0046)  gabapentin (NEURONTIN) capsule 300 mg (300 mg Oral Given 01/15/20 0047)    ED Course  I have reviewed the triage vital signs and the nursing notes.  Pertinent labs & imaging results that were available during my care of the patient were reviewed by me and considered in my medical decision making (see chart for details).  34  year old female here  with worsening of her left-sided chest pain in the setting of recent PE subsequent to COVID-19 diagnosis, and likely pulmonary infarct seen on last hospital CT PE scan.  Today she remains tachycardic but is not tachypneic or hypoxic.  DDx for her pain includes pulmonary infarct vs worsening clot burden vs PNA vs PTX vs other  Labs ordered including trop (3), BNP 44, BMP with Cr 0.6, CBC with WBC 12.7 (downtrending from 16 five days ago), hgb 14.7.  Xray chest and repeat CT PE obtained which demonstrated possible new infiltrate in LLL but no new clot burden, no PTX.  ECG personally reviewed showing sinus tachycardia with no acute ischemic findings.  Given this workup, I felt the patient was most likely suffering from continued pain from her prior infarct, perhaps complicated by a new LLL PNA due to respiratory splinting.  Her WBC is trending down and she is afebrile - I do not believe she has sepsis.  I felt it was reasonable to achieve pain control here and treat for PNA with IV antibiotics, then discharge on 9 days of doxycycline.  I do not see an emergent indication for re-hospitalization at this point, but if we can achieve better pain control with dilaudid, I can provide her PO dilaudid to replace her oxycodone at home.  We'll also give an incentive spirometer.   Clinical Course as of Jan 15 956  Wynelle Link Jan 14, 2020  2234 Troponin I (High Sensitivity): 3 [MT]  2343 IMPRESSION: 1. Motion and streak artifact limits the technical quality of the examination. Possible residual peripheral emboli in the left lung base although less prominent than on the prior study and difficult to see due to consolidation. Otherwise, no residual or recurrent filling defects are identified. Changes are consistent with good response to interval therapy. 2. Consolidation and atelectasis in the left lung base. This could represent pulmonary infarct or pneumonia. Centrally obstructing process is not  excluded and follow-up to resolution is recommended. 3. Moderate left pleural effusion, increased since previous study.   [MT]  Mon Jan 15, 2020  0003 Reviewed CTPE with patient.  No evident new infarct or worsening infarcts.  Questionable PNA in LLL now.  I would treat this given her respiratory splinting from pain at higher risk.  Will also redose dilaudid and give gabapentin as well.  I suspect now this is simply a matter of chronic pain management from her infarcts, as it appears that she felt that she her pain was never truly controlled at discharge.  I can offer her a short course of p.o. Dilaudid at home, as well as gabapentin, and an incentive spirometer.  Patient signed out to Dr Pilar Plate EDP overnight with plan to reassess pain after completion of IV antibiotics, with anticipation of discharge home.   [MT]    Clinical Course User Index [MT] Terald Sleeper, MD    Final Clinical Impression(s) / ED Diagnoses Final diagnoses:  Chest pain, unspecified type  Community acquired pneumonia of left lower lobe of lung    Rx / DC Orders ED Discharge Orders         Ordered    doxycycline (VIBRAMYCIN) 100 MG capsule  2 times daily        01/15/20 0028    gabapentin (NEURONTIN) 300 MG capsule  3 times daily        01/15/20 0028    HYDROmorphone (DILAUDID) 2 MG tablet  Every 6 hours PRN        01/15/20 0028  docusate sodium (COLACE) 100 MG capsule  Daily        01/15/20 0028           Terald Sleeperrifan, Damoni Causby J, MD 01/15/20 [id number]    Terald Sleeperrifan, Ferol Laiche J, MD 01/15/20 [phone]

## 2020-01-15 LAB — TYPE AND SCREEN
ABO/RH(D): O POS
Antibody Screen: POSITIVE
PT AG Type: NEGATIVE

## 2020-01-15 MED ORDER — GABAPENTIN 300 MG PO CAPS: 300.0000 mg | ORAL_CAPSULE | Freq: Three times a day (TID) | ORAL | 0 refills | Status: DC

## 2020-01-15 MED ORDER — HYDROMORPHONE HCL 2 MG PO TABS
2.0000 mg | ORAL_TABLET | Freq: Four times a day (QID) | ORAL | 0 refills | Status: DC | PRN
Start: 2020-01-15 — End: 2020-02-06

## 2020-01-15 MED ORDER — HYDROMORPHONE HCL 1 MG/ML IJ SOLN
1.0000 mg | Freq: Once | INTRAMUSCULAR | Status: AC
  Administered 2020-01-15: 1 mg via INTRAVENOUS
  Filled 2020-01-15: qty 1

## 2020-01-15 MED ORDER — DOXYCYCLINE HYCLATE 100 MG PO TABS
100.0000 mg | ORAL_TABLET | Freq: Once | ORAL | Status: AC
  Administered 2020-01-15: 100 mg via ORAL
  Filled 2020-01-15: qty 1

## 2020-01-15 MED ORDER — DOCUSATE SODIUM 100 MG PO CAPS: 100.0000 mg | ORAL_CAPSULE | Freq: Every day | ORAL | 0 refills | Status: AC

## 2020-01-15 MED ORDER — DOXYCYCLINE HYCLATE 100 MG PO CAPS: 100.0000 mg | ORAL_CAPSULE | Freq: Two times a day (BID) | ORAL | 0 refills | Status: AC

## 2020-01-15 MED ORDER — SODIUM CHLORIDE 0.9 % IV SOLN
1.0000 g | Freq: Once | INTRAVENOUS | Status: AC
  Administered 2020-01-15: 1 g via INTRAVENOUS
  Filled 2020-01-15: qty 10

## 2020-01-15 MED ORDER — GABAPENTIN 300 MG PO CAPS
300.0000 mg | ORAL_CAPSULE | Freq: Once | ORAL | Status: AC
  Administered 2020-01-15: 300 mg via ORAL
  Filled 2020-01-15: qty 1

## 2020-01-15 NOTE — ED Provider Notes (Signed)
  Provider Note MRN:  [id number]  Arrival date & time: 01/15/20    ED Course and Medical Decision Making  Assumed care from Dr. Renaye Rakers at shift change.  On my reassessment patient is feeling better and requesting discharge.  Pain is well controlled.  She is in no acute distress, breathing comfortably.  Remains mildly tachycardic, 107 on my evaluation.  Advised continued hydration, pain management, antibiotics at home, strict return precautions.  Procedures  Final Clinical Impressions(s) / ED Diagnoses     ICD-10-CM   1. Chest pain, unspecified type  R07.9   2. Community acquired pneumonia of left lower lobe of lung  J18.9     ED Discharge Orders         Ordered    doxycycline (VIBRAMYCIN) 100 MG capsule  2 times daily        01/15/20 0028    gabapentin (NEURONTIN) 300 MG capsule  3 times daily        01/15/20 0028    HYDROmorphone (DILAUDID) 2 MG tablet  Every 6 hours PRN        01/15/20 0028    docusate sodium (COLACE) 100 MG capsule  Daily        01/15/20 0028            Discharge Instructions     I started you on treatment for pneumonia in your left lung.  You will take another 9 days of doxcycyline, an antibiotic, beginning tomorrow on 01/15/20 in the morning.  I prescribed you a stronger pain pill called dilaudid, as well as gabapentin, a nerve pill.  Remember to take these medications with stool softeners to avoid constipation.  Do NOT take dilaudid at the same time as oxycodone - these are both opioid narcotics, and taking them together puts you at high risk for an overdose.  Dilaudid is meant to REPLACE your oxycodone medication at home.    Elmer Sow. Pilar Plate, MD [facility] Emergency Medicine [facility] [email or web address]    Sabas Sous, MD 01/15/20 [phone]

## 2020-01-15 NOTE — Discharge Instructions (Addendum)
I started you on treatment for pneumonia in your left lung.  You will take another 9 days of doxcycyline, an antibiotic, beginning tomorrow on 01/15/20 in the morning.  I prescribed you a stronger pain pill called dilaudid, as well as gabapentin, a nerve pill.  Remember to take these medications with stool softeners to avoid constipation.  Do NOT take dilaudid at the same time as oxycodone - these are both opioid narcotics, and taking them together puts you at high risk for an overdose.  Dilaudid is meant to REPLACE your oxycodone medication at home.

## 2020-02-05 ENCOUNTER — Inpatient Hospital Stay: Payer: Self-pay | Admitting: Internal Medicine

## 2020-02-06 ENCOUNTER — Encounter: Payer: Self-pay | Admitting: Internal Medicine

## 2020-02-06 ENCOUNTER — Ambulatory Visit: Payer: Medicaid Other | Attending: Internal Medicine | Admitting: Internal Medicine

## 2020-02-06 ENCOUNTER — Other Ambulatory Visit: Payer: Self-pay

## 2020-02-06 VITALS — BP 122/89 | HR 99 | Resp 16 | Ht 62.0 in | Wt 154.0 lb

## 2020-02-06 DIAGNOSIS — Z8616 Personal history of COVID-19: Secondary | ICD-10-CM | POA: Insufficient documentation

## 2020-02-06 DIAGNOSIS — Z09 Encounter for follow-up examination after completed treatment for conditions other than malignant neoplasm: Secondary | ICD-10-CM

## 2020-02-06 DIAGNOSIS — J1282 Pneumonia due to coronavirus disease 2019: Secondary | ICD-10-CM | POA: Insufficient documentation

## 2020-02-06 DIAGNOSIS — F17290 Nicotine dependence, other tobacco product, uncomplicated: Secondary | ICD-10-CM | POA: Insufficient documentation

## 2020-02-06 DIAGNOSIS — Z7901 Long term (current) use of anticoagulants: Secondary | ICD-10-CM | POA: Diagnosis not present

## 2020-02-06 DIAGNOSIS — U071 COVID-19: Secondary | ICD-10-CM

## 2020-02-06 DIAGNOSIS — F17200 Nicotine dependence, unspecified, uncomplicated: Secondary | ICD-10-CM

## 2020-02-06 DIAGNOSIS — Z91410 Personal history of adult physical and sexual abuse: Secondary | ICD-10-CM | POA: Insufficient documentation

## 2020-02-06 DIAGNOSIS — I2699 Other pulmonary embolism without acute cor pulmonale: Secondary | ICD-10-CM

## 2020-02-06 DIAGNOSIS — Z79899 Other long term (current) drug therapy: Secondary | ICD-10-CM | POA: Diagnosis not present

## 2020-02-06 DIAGNOSIS — F322 Major depressive disorder, single episode, severe without psychotic features: Secondary | ICD-10-CM | POA: Diagnosis not present

## 2020-02-06 DIAGNOSIS — T7491XA Unspecified adult maltreatment, confirmed, initial encounter: Secondary | ICD-10-CM

## 2020-02-06 DIAGNOSIS — I10 Essential (primary) hypertension: Secondary | ICD-10-CM | POA: Insufficient documentation

## 2020-02-06 DIAGNOSIS — F411 Generalized anxiety disorder: Secondary | ICD-10-CM | POA: Diagnosis not present

## 2020-02-06 MED ORDER — AMLODIPINE BESYLATE 5 MG PO TABS: 2.5000 mg | ORAL_TABLET | Freq: Every day | ORAL | 1 refills | Status: DC

## 2020-02-06 MED ORDER — SERTRALINE HCL 50 MG PO TABS: ORAL_TABLET | ORAL | 1 refills | Status: DC

## 2020-02-06 MED ORDER — APIXABAN 5 MG PO TABS: 5.0000 mg | ORAL_TABLET | Freq: Two times a day (BID) | ORAL | 1 refills | Status: DC

## 2020-02-06 NOTE — Patient Instructions (Signed)
Start Zoloft as discussed for depression and anxiety. I have referred you to psychiatry.  They will call you with the appointment.  Continue the blood thinner Eliquis.
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
## 2020-02-15 ENCOUNTER — Other Ambulatory Visit: Payer: Self-pay

## 2020-02-15 ENCOUNTER — Ambulatory Visit: Payer: Medicaid Other | Attending: Internal Medicine | Admitting: Licensed Clinical Social Worker

## 2020-02-15 DIAGNOSIS — Z0289 Encounter for other administrative examinations: Secondary | ICD-10-CM

## 2020-02-15 DIAGNOSIS — F322 Major depressive disorder, single episode, severe without psychotic features: Secondary | ICD-10-CM

## 2020-02-15 NOTE — BH Specialist Note (Signed)
Integrated Behavioral Health Visit via Telemedicine (Telephone)  02/15/2020 Ann Mcmillan [id number]  Number of Integrated Behavioral Health visits: 1 Session Start time: 10:35 AM  Session End time: 11:05 AM Total time: 30 minutes  Referring Provider: Dr. Laural Benes Type of Service: Individual Patient location: Home [facility] Provider location: Office All persons participating in visit: LCSW and Patient   I connected with Ann Mcmillan by telephone and verified that I am speaking with the correct person using two identifiers.   Discussed confidentiality: Yes   Confirmed demographics & insurance:  Yes   I discussed that engaging in this virtual visit, they consent to the provision of behavioral healthcare and the services will be billed under their insurance.   Patient and/or legal guardian expressed understanding and consented to virtual visit: Yes   PRESENTING CONCERNS: Patient or family reports the following symptoms/concerns: Pt reports difficulty managing increase in depression and anxiety symptoms, including, low energy, feelings of overwhelm, decreased appetite, interrupted sleep, and irritability Duration of problem: Ongoing; Severity of problem: moderate  STRENGTHS (Protective Factors/Coping Skills): Social connections, Social and Emotional competence and Concrete supports in place (healthy food, safe environments, etc.)  ASSESSMENT: Patient currently experiencing difficulty managing increase in depression and anxiety symptoms triggered by stress and extensive trauma.  Pt will benefit from therapy and continued medication management.    GOALS ADDRESSED: Patient will: 1.  Reduce symptoms of: anxiety and depression  Pt agreed to continue taking medications as prescribed 2.  Increase knowledge and/or ability of: stress reduction Pt agreed to utilize healthy coping skills identified in session  Progress of Goals: Ongoing  INTERVENTIONS: Interventions utilized:  Solution-Focused  Strategies, Supportive Counseling and Psychoeducation and/or Health Education Standardized Assessments completed & reviewed: Not Needed   OUTCOME: Patient Response: Pt was engaged during session and successfully identified self-care strategies to assist in management of symptoms. She is interested in initiating brief therapy.    PLAN: 1. Follow up with behavioral health clinician on : 02/28/20 2. Behavioral recommendations: Utilize strategies discussed and comply with medication management 3. Referral(s): Integrated [employer] (In Clinic)  I discussed the assessment and treatment plan with the patient and/or parent/guardian. They were provided an opportunity to ask questions and all were answered. They agreed with the plan and demonstrated an understanding of the instructions.   They were advised to call back or seek an in-person evaluation as appropriate.  I discussed that the purpose of this visit is to provide behavioral health care while limiting exposure to the novel coronavirus.  Discussed there is a possibility of technology failure and discussed alternative modes of communication if that failure occurs.  Bridgett Larsson, LCSW 02/27/2020 5:02 PM

## 2020-02-19 ENCOUNTER — Telehealth: Payer: Self-pay | Admitting: Internal Medicine

## 2020-02-19 NOTE — Telephone Encounter (Signed)
Received fax for FMLA paperwork

## 2020-02-21 NOTE — Telephone Encounter (Signed)
Dr. Laural Benes has new FMLA paperwork will fax once received

## 2020-02-22 DIAGNOSIS — Z0289 Encounter for other administrative examinations: Secondary | ICD-10-CM

## 2020-02-22 NOTE — Telephone Encounter (Signed)
Patient aware that paperwork is ready for pickup at the front desk. Paperwork requires patient signature.   She states she is on the way today. Aware of office hours. Will let us know if she wants [place] to fax or if she wants to take. Will make a copy and scan to chart if she takes.

## 2020-02-22 NOTE — Telephone Encounter (Signed)
Pt called to see If FMLA paperwork was ready for her to pick up/ please advise

## 2020-02-28 ENCOUNTER — Other Ambulatory Visit: Payer: Self-pay

## 2020-02-28 ENCOUNTER — Ambulatory Visit: Payer: MEDICAID | Attending: Internal Medicine | Admitting: Licensed Clinical Social Worker

## 2020-02-28 DIAGNOSIS — F411 Generalized anxiety disorder: Secondary | ICD-10-CM

## 2020-02-28 DIAGNOSIS — F331 Major depressive disorder, recurrent, moderate: Secondary | ICD-10-CM

## 2020-02-28 NOTE — Progress Notes (Signed)
Integrated Behavioral Health Visit via Telemedicine (Telephone)  02/28/2020 Ann Mcmillan [id number]  Number of Integrated Behavioral Health visits: 2 Session Start time: 10:00 AM  Session End time: 10:20 AM Total time: 20 minutes  Referring Provider: Dr. Laural Benes Type of Service: Individual Patient location: Home [facility] Provider location: Office All persons participating in visit: LCSW and Patient   I connected with Ann Mcmillan by telephone and verified that I am speaking with the correct person using two identifiers.   Discussed confidentiality: Yes   Confirmed demographics & insurance:  Yes   I discussed that engaging in this virtual visit, they consent to the provision of behavioral healthcare and the services will be billed under their insurance.   Patient and/or legal guardian expressed understanding and consented to virtual visit: Yes   PRESENTING CONCERNS: Patient or family reports the following symptoms/concerns: Pt reports difficulty managing withdrawn behavior, increase in irritability, feelings of sadness, and overwhelm. Triggers include stress from work. Pt had discontinued prescribed medications; however, returned to taking it after an adjustment in dose. Duration of problem: Ongoing; Severity of problem: moderate  STRENGTHS (Protective Factors/Coping Skills): Social connections, Social and Emotional competence, Concrete supports in place (healthy food, safe environments, etc.) and Sense of purpose  ASSESSMENT: Patient currently experiencing difficulty managing depression and anxiety symptoms.  Pt will benefit from compliance with medication management and therapy.    GOALS ADDRESSED: Patient will: 1.  Reduce symptoms of: anxiety, depression and stress Pt agreed to comply with med management 2.  Increase knowledge and/or ability of: stress reduction Pt agreed to continue to utilize self-care strategies to improve management of stress 3.  Demonstrate ability to:  Increase motivation to adhere to plan of care Pt agreed to contact PCP with any concerns regarding potential side effects to medications  Progress of Goals: Ongoing  INTERVENTIONS: Interventions utilized:  Mindfulness or Relaxation Training Standardized Assessments completed & reviewed: Not Needed   OUTCOME: Patient Response: Pt was engaged during session and was successful in identifying self-care strategies to assist in management of stressors. She has not heard from [facility] and was provided their contact information to schedule an appointment for therapy.    PLAN: 1. Follow up with behavioral health clinician on : 03/20/2020 2. Behavioral recommendations: Utilize strategies discussed and comply with med management 3. Referral(s): Integrated [employer] (In Clinic) and [facility] (LME/Outside Clinic)  I discussed the assessment and treatment plan with the patient and/or parent/guardian. They were provided an opportunity to ask questions and all were answered. They agreed with the plan and demonstrated an understanding of the instructions.   They were advised to call back or seek an in-person evaluation as appropriate.  I discussed that the purpose of this visit is to provide behavioral health care while limiting exposure to the novel coronavirus.  Discussed there is a possibility of technology failure and discussed alternative modes of communication if that failure occurs.  Bridgett Larsson, LCSW 03/09/2020 5:29 AM

## 2020-03-20 ENCOUNTER — Ambulatory Visit: Payer: Medicaid Other | Admitting: Licensed Clinical Social Worker

## 2020-03-20 ENCOUNTER — Telehealth: Payer: Self-pay | Admitting: Licensed Clinical Social Worker

## 2020-03-20 NOTE — Telephone Encounter (Signed)
Call placed to patient regarding IBH appointment. LCSW left message requesting a return call.  °

## 2020-04-02 ENCOUNTER — Ambulatory Visit: Payer: Medicaid Other | Admitting: Internal Medicine

## 2020-04-05 ENCOUNTER — Other Ambulatory Visit: Payer: Self-pay

## 2020-04-05 ENCOUNTER — Telehealth (HOSPITAL_COMMUNITY): Payer: Medicaid Other | Admitting: Psychiatry

## 2020-05-24 ENCOUNTER — Ambulatory Visit: Payer: Self-pay | Admitting: Internal Medicine

## 2020-07-22 ENCOUNTER — Other Ambulatory Visit: Payer: Self-pay

## 2020-07-22 ENCOUNTER — Ambulatory Visit: Payer: Medicaid Other | Attending: Internal Medicine | Admitting: Internal Medicine

## 2020-07-22 DIAGNOSIS — Z5329 Procedure and treatment not carried out because of patient's decision for other reasons: Secondary | ICD-10-CM

## 2020-07-22 DIAGNOSIS — Z91199 Patient's noncompliance with other medical treatment and regimen due to unspecified reason: Secondary | ICD-10-CM

## 2020-07-22 NOTE — Progress Notes (Signed)
Patient was scheduled for telephone visit.  She no showed this appointment.  No answer at 4 p.m.

## 2020-07-23 ENCOUNTER — Other Ambulatory Visit: Payer: Self-pay

## 2020-07-23 ENCOUNTER — Emergency Department (HOSPITAL_COMMUNITY): Payer: Medicaid Other

## 2020-07-23 ENCOUNTER — Emergency Department (HOSPITAL_COMMUNITY)
Admission: EM | Admit: 2020-07-23 | Discharge: 2020-07-24 | Disposition: A | Discharge status: Home / Self Care | Payer: Medicaid Other | Attending: Emergency Medicine | Admitting: Emergency Medicine

## 2020-07-23 ENCOUNTER — Encounter (HOSPITAL_COMMUNITY): Payer: Self-pay

## 2020-07-23 DIAGNOSIS — I10 Essential (primary) hypertension: Secondary | ICD-10-CM | POA: Diagnosis not present

## 2020-07-23 DIAGNOSIS — R0789 Other chest pain: Secondary | ICD-10-CM | POA: Diagnosis not present

## 2020-07-23 DIAGNOSIS — J45909 Unspecified asthma, uncomplicated: Secondary | ICD-10-CM | POA: Diagnosis not present

## 2020-07-23 DIAGNOSIS — F419 Anxiety disorder, unspecified: Secondary | ICD-10-CM | POA: Insufficient documentation

## 2020-07-23 DIAGNOSIS — Z8616 Personal history of COVID-19: Secondary | ICD-10-CM | POA: Insufficient documentation

## 2020-07-23 DIAGNOSIS — F17290 Nicotine dependence, other tobacco product, uncomplicated: Secondary | ICD-10-CM | POA: Insufficient documentation

## 2020-07-23 DIAGNOSIS — R079 Chest pain, unspecified: Secondary | ICD-10-CM | POA: Diagnosis present

## 2020-07-23 LAB — CBC WITH DIFFERENTIAL/PLATELET
Abs Immature Granulocytes: 0.01 10*3/uL (ref 0.00–0.07)
Basophils Absolute: 0.1 10*3/uL (ref 0.0–0.1)
Basophils Relative: 1 %
Eosinophils Absolute: 0.1 10*3/uL (ref 0.0–0.5)
Eosinophils Relative: 3 %
HCT: 37.9 % (ref 36.0–46.0)
Hemoglobin: 12.9 g/dL (ref 12.0–15.0)
Immature Granulocytes: 0 %
Lymphocytes Relative: 48 %
Lymphs Abs: 2.6 10*3/uL (ref 0.7–4.0)
MCH: 30.4 pg (ref 26.0–34.0)
MCHC: 34 g/dL (ref 30.0–36.0)
MCV: 89.2 fL (ref 80.0–100.0)
Monocytes Absolute: 0.5 10*3/uL (ref 0.1–1.0)
Monocytes Relative: 9 %
Neutro Abs: 2.1 10*3/uL (ref 1.7–7.7)
Neutrophils Relative %: 39 %
Platelets: 366 10*3/uL (ref 150–400)
RBC: 4.25 MIL/uL (ref 3.87–5.11)
RDW: 13.8 % (ref 11.5–15.5)
WBC: 5.4 10*3/uL (ref 4.0–10.5)
nRBC: 0 % (ref 0.0–0.2)

## 2020-07-23 LAB — COMPREHENSIVE METABOLIC PANEL
ALT: 18 U/L (ref 0–44)
AST: 22 U/L (ref 15–41)
Albumin: 4.1 g/dL (ref 3.5–5.0)
Alkaline Phosphatase: 55 U/L (ref 38–126)
Anion gap: 13 (ref 5–15)
BUN: 8 mg/dL (ref 6–20)
CO2: 22 mmol/L (ref 22–32)
Calcium: 9.2 mg/dL (ref 8.9–10.3)
Chloride: 105 mmol/L (ref 98–111)
Creatinine, Ser: 0.67 mg/dL (ref 0.44–1.00)
GFR, Estimated: >60 mL/min (ref >60)
Glucose, Bld: 86 mg/dL (ref 70–99)
Potassium: 3.2 mmol/L — ABNORMAL LOW (ref 3.5–5.1)
Sodium: 140 mmol/L (ref 135–145)
Total Bilirubin: 0.1 mg/dL — ABNORMAL LOW (ref 0.3–1.2)
Total Protein: 8.3 g/dL — ABNORMAL HIGH (ref 6.5–8.1)

## 2020-07-23 LAB — TROPONIN I (HIGH SENSITIVITY)
Troponin I (High Sensitivity): 4 ng/L (ref <18)
Troponin I (High Sensitivity): 4 ng/L (ref <18)

## 2020-07-23 LAB — LIPASE, BLOOD: Lipase: 30 U/L (ref 11–51)

## 2020-07-23 LAB — I-STAT BETA HCG BLOOD, ED (NOT ORDERABLE): I-stat hCG, quantitative: <5 m[IU]/mL (ref <5)

## 2020-07-23 MED ORDER — IOHEXOL 350 MG/ML SOLN
100.0000 mL | Freq: Once | INTRAVENOUS | Status: AC | PRN
  Administered 2020-07-23: 100 mL via INTRAVENOUS

## 2020-07-23 MED ORDER — ONDANSETRON HCL 4 MG/2ML IJ SOLN
4.0000 mg | Freq: Once | INTRAMUSCULAR | Status: AC
  Administered 2020-07-23: 4 mg via INTRAVENOUS
  Filled 2020-07-23: qty 2

## 2020-07-23 MED ORDER — SODIUM CHLORIDE 0.9 % IV BOLUS
1000.0000 mL | Freq: Once | INTRAVENOUS | Status: AC
  Administered 2020-07-23: 1000 mL via INTRAVENOUS

## 2020-07-23 MED ORDER — HYDROCODONE-ACETAMINOPHEN 5-325 MG PO TABS: 1.0000 | ORAL_TABLET | ORAL | 0 refills | Status: DC | PRN

## 2020-07-23 MED ORDER — MORPHINE SULFATE (PF) 4 MG/ML IV SOLN
4.0000 mg | Freq: Once | INTRAVENOUS | Status: AC
  Administered 2020-07-23: 4 mg via INTRAVENOUS
  Filled 2020-07-23: qty 1
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
## 2020-07-23 NOTE — ED Triage Notes (Signed)
Pt reports constant chest pains since covid-19 diagnosis in September 21'. Pt reports blood clots and taking Eliquis for but has finished taking them. Pt sts left chest/ breast pains that changes 2 weeks ago that radiates to left shoulder.

## 2020-07-23 NOTE — ED Triage Notes (Signed)
Emergency Medicine Provider Triage Evaluation Note  Ann Mcmillan , a 35 y.o. female  was evaluated in triage.  Pt complains of chest pain.  She had COVID 19 in September 2021 and after that had PEs.  She finished her 3 months of Eliquis however has been unable to follow-up with her PCP.  She states that over the past 2 weeks she has had pain radiating into her left shoulder.  Reports that she is anxious and scared as she does not know what is going on.  She states "I just want the pain to stop."  Physical Exam  BP (!) 202/125   Pulse (!) 108   Temp 99.1 F (37.3 C) (Oral)   Resp (!) 24   Ht 5\' 2"  (1.575 m)   Wt 69.9 kg   SpO2 100%   BMI 28.19 kg/m  Patient is awake and alert, she is tearful and anxious.  She is coached to slow her breathing which significantly improves her heart rate.  She, when not distracted, has mild psychomotor agitation is fidgety and constantly moving.  When coached to slow her breathing respirations are even and unlabored.  Medical Decision Making  Medically screening exam initiated at 9:19 PM.  Appropriate orders placed.  Ka Bench was informed that the remainder of the evaluation will be completed by another provider, this initial triage assessment does not replace that evaluation, and the importance of remaining in the ED until their evaluation is complete.     [name], Cristina Gong 07/23/20 2122

## 2021-01-15 NOTE — Progress Notes (Signed)
Erroneous encounter.  Chart opened in error. 

## 2021-07-28 ENCOUNTER — Emergency Department (HOSPITAL_COMMUNITY)
Admission: EM | Admit: 2021-07-28 | Discharge: 2021-07-28 | Disposition: A | Discharge status: Home / Self Care | Payer: Medicaid Other | Attending: Emergency Medicine | Admitting: Emergency Medicine

## 2021-07-28 ENCOUNTER — Other Ambulatory Visit: Payer: Self-pay

## 2021-07-28 ENCOUNTER — Encounter (HOSPITAL_COMMUNITY): Payer: Self-pay | Admitting: Emergency Medicine

## 2021-07-28 DIAGNOSIS — J45909 Unspecified asthma, uncomplicated: Secondary | ICD-10-CM | POA: Diagnosis not present

## 2021-07-28 DIAGNOSIS — R300 Dysuria: Secondary | ICD-10-CM | POA: Diagnosis present

## 2021-07-28 DIAGNOSIS — N3001 Acute cystitis with hematuria: Secondary | ICD-10-CM | POA: Insufficient documentation

## 2021-07-28 DIAGNOSIS — Z8616 Personal history of COVID-19: Secondary | ICD-10-CM | POA: Insufficient documentation

## 2021-07-28 LAB — URINALYSIS, ROUTINE W REFLEX MICROSCOPIC
Bilirubin Urine: NEGATIVE
Glucose, UA: NEGATIVE mg/dL
Ketones, ur: NEGATIVE mg/dL
Nitrite: NEGATIVE
Protein, ur: 100 mg/dL — AB
Specific Gravity, Urine: 1.018 (ref 1.005–1.030)
WBC, UA: >50 WBC/hpf — ABNORMAL HIGH (ref 0–5)
pH: 6 (ref 5.0–8.0)

## 2021-07-28 MED ORDER — PHENAZOPYRIDINE HCL 200 MG PO TABS: 200.0000 mg | ORAL_TABLET | Freq: Three times a day (TID) | ORAL | 0 refills | Status: DC

## 2021-07-28 MED ORDER — CEPHALEXIN 500 MG PO CAPS: 500.0000 mg | ORAL_CAPSULE | Freq: Three times a day (TID) | ORAL | 0 refills | Status: AC

## 2021-07-28 NOTE — ED Provider Notes (Signed)
?[facility] ?Provider Note ? ? ?CSN: [id number] ?Arrival date & time: 07/28/21  [id number] ? ?  ? ?History ? ?Chief Complaint  ?Patient presents with  ? Dysuria  ? ? ?Ann Mcmillan is a 36 y.o. female. ? ?36 year old female presents today for evaluation of dysuria x1 week.  Denies fever, chills.  States since last night she is having left-sided flank pain.  She has taken Azo without relief.  She also has history of pulmonary embolism associated with COVID-19 back in 2021. ? ?The history is provided by the patient. No language interpreter was used.  ? ?  ? ?Home Medications ?Prior to Admission medications   ?Medication Sig Start Date End Date Taking? Authorizing Provider  ?acetaminophen (TYLENOL) 500 MG tablet Take 2 tablets (1,000 mg total) by mouth every 8 (eight) hours as needed. ?Patient taking differently: Take 1,000 mg by mouth every 8 (eight) hours as needed for headache (pain). 01/10/20   Zigmund DanielPowell, A Caldwell Jr., MD  ?HYDROcodone-acetaminophen (NORCO/VICODIN) 5-325 MG tablet Take 1 tablet by mouth every 4 (four) hours as needed. 07/23/20   Jacalyn LefevreHaviland, Julie, MD  ?loratadine (CLARITIN) 10 MG tablet Take 1 tablet (10 mg total) by mouth daily. ?Patient not taking: Reported on 07/03/2017 07/28/16 05/03/19  Antony MaduraHumes, Kelly, PA-C  ?   ? ?Allergies    ?Patient has no known allergies.   ? ?Review of Systems   ?Review of Systems  ?Constitutional:  Negative for chills and fever.  ?Respiratory:  Negative for shortness of breath.   ?Cardiovascular:  Negative for chest pain.  ?Genitourinary:  Positive for dysuria, flank pain and frequency.  ?Neurological:  Negative for weakness and light-headedness.  ?All other systems reviewed and are negative. ? ?Physical Exam ?Updated Vital Signs ?BP (!) 204/128 (BP Location: Right Arm)   Pulse (!) 114   Temp 98.1 ?F (36.7 ?C) (Oral)   Resp 17   Ht 5\' 2"  (1.575 m)   Wt 75.3 kg   LMP 07/14/2021   SpO2 96%   BMI 30.36 kg/m?  ?Physical Exam ?Vitals and nursing  note reviewed.  ?Constitutional:   ?   General: She is not in acute distress. ?   Appearance: Normal appearance. She is not ill-appearing.  ?HENT:  ?   Head: Normocephalic and atraumatic.  ?   Nose: Nose normal.  ?Eyes:  ?   General: No scleral icterus. ?   Extraocular Movements: Extraocular movements intact.  ?   Conjunctiva/sclera: Conjunctivae normal.  ?Cardiovascular:  ?   Rate and Rhythm: Normal rate and regular rhythm.  ?   Pulses: Normal pulses.  ?   Heart sounds: Normal heart sounds.  ?Pulmonary:  ?   Effort: Pulmonary effort is normal. No respiratory distress.  ?   Breath sounds: Normal breath sounds. No wheezing or rales.  ?Abdominal:  ?   General: There is no distension.  ?   Tenderness: There is no abdominal tenderness. There is no right CVA tenderness, left CVA tenderness or guarding.  ?Musculoskeletal:     ?   General: Normal range of motion.  ?   Cervical back: Normal range of motion.  ?   Right lower leg: No edema.  ?   Left lower leg: No edema.  ?Skin: ?   General: Skin is warm and dry.  ?Neurological:  ?   General: No focal deficit present.  ?   Mental Status: She is alert. Mental status is at baseline.  ? ? ?ED Results / Procedures /  Treatments   ?Labs ?(all labs ordered are listed, but only abnormal results are displayed) ?Labs Reviewed  ?URINALYSIS, ROUTINE W REFLEX MICROSCOPIC - Abnormal; Notable for the following components:  ?    Result Value  ? Color, Urine AMBER (*)   ? APPearance CLOUDY (*)   ? Hgb urine dipstick SMALL (*)   ? Protein, ur 100 (*)   ? Leukocytes,Ua LARGE (*)   ? WBC, UA >50 (*)   ? Bacteria, UA RARE (*)   ? Non Squamous Epithelial 0-5 (*)   ? All other components within normal limits  ?URINE CULTURE  ? ? ?EKG ?None ? ?Radiology ?No results found. ? ?Procedures ?Procedures  ? ? ?Medications Ordered in ED ?Medications - No data to display ? ?ED Course/ Medical Decision Making/ A&P ?  ?                        ?Medical Decision Making ?Amount and/or Complexity of Data  Reviewed ?Labs: ordered. ? ? ?Medical Decision Making / ED Course ? ? ?This patient presents to the ED for concern of dysuria, this involves an extensive number of treatment options, and is a complaint that carries with it a high risk of complications and morbidity.  The differential diagnosis includes pyelonephritis, UTI ? ?MDM: ?36 year old female presents with 1 week duration of dysuria.  She has taken Azo without relief.  She is without other complaints.  States since last night she is having some discomfort in her left flank.  Abdomen nontender and nondistended.  Flanks without tenderness on exam.  UA shows signs of UTI.  Nitrite negative.  We will send a urine culture.  Will start on Keflex.  Return precautions discussed.  Patient voices understanding and is in agreement with plan.  Initially noted to be tachycardic at 114 however on my exam patient improved to 90 without intervention.  Patient is appropriate for discharge.  Discharged in stable condition.  Return precautions discussed. ? ?Lab Tests: ?-I ordered, reviewed, and interpreted labs.   ?The pertinent results include:   ?Labs Reviewed  ?URINALYSIS, ROUTINE W REFLEX MICROSCOPIC - Abnormal; Notable for the following components:  ?    Result Value  ? Color, Urine AMBER (*)   ? APPearance CLOUDY (*)   ? Hgb urine dipstick SMALL (*)   ? Protein, ur 100 (*)   ? Leukocytes,Ua LARGE (*)   ? WBC, UA >50 (*)   ? Bacteria, UA RARE (*)   ? Non Squamous Epithelial 0-5 (*)   ? All other components within normal limits  ?URINE CULTURE  ?  ? ? ?EKG ? EKG Interpretation ? ?Date/Time:    ?Ventricular Rate:    ?PR Interval:    ?QRS Duration:   ?QT Interval:    ?QTC Calculation:   ?R Axis:     ?Text Interpretation:   ?  ? ?  ? ? ?Medicines ordered and prescription drug management: ?No orders of the defined types were placed in this encounter. ?  ?-I have reviewed the patients home medicines and have made adjustments as needed ? ?Co morbidities that complicate the  patient evaluation ? ?Past Medical History:  ?Diagnosis Date  ? Asthma   ? COVID-19   ? Pulmonary emboli (HCC) 01/04/2020  ?  ? ? ?Dispostion: ?Patient is appropriate for discharge.  Discharged in stable condition.  Return precautions discussed.  Keflex prescribed. ? ? ?Final Clinical Impression(s) / ED Diagnoses ?Final diagnoses:  ?Acute cystitis  with hematuria  ? ? ?Rx / DC Orders ?ED Discharge Orders   ? ?      Ordered  ?  cephALEXin (KEFLEX) 500 MG capsule  3 times daily       ? 07/28/21 0733  ?  phenazopyridine (PYRIDIUM) 200 MG tablet  3 times daily       ? 07/28/21 0733  ? ?  ?  ? ?  ? ? ?  ?[name], PA-C ?07/28/21 3846 ? ?  ?Margarita Grizzle, MD ?07/28/21 1759 ? ?

## 2021-07-28 NOTE — Discharge Instructions (Addendum)
Most likely have a UTI.  Does have a urine culture obtained from the bacteria.  In the meantime I have sent antibiotic and Pyridium into the pharmacy for you.  If you have any worsening symptoms please return or follow-up with your primary care provider. ?

## 2021-07-28 NOTE — ED Triage Notes (Signed)
Pt c/o dysuria x 1 week. Pt has been taking AZO pills. Pt hypertensive in triage, no hx ?

## 2021-07-29 LAB — URINE CULTURE

## 2022-01-16 ENCOUNTER — Emergency Department (HOSPITAL_COMMUNITY): Payer: Commercial Managed Care - HMO

## 2022-01-16 ENCOUNTER — Emergency Department (HOSPITAL_COMMUNITY)
Admission: EM | Admit: 2022-01-16 | Discharge: 2022-01-16 | Disposition: A | Discharge status: Home / Self Care | Payer: Commercial Managed Care - HMO | Attending: Emergency Medicine | Admitting: Emergency Medicine

## 2022-01-16 ENCOUNTER — Encounter (HOSPITAL_COMMUNITY): Payer: Self-pay

## 2022-01-16 ENCOUNTER — Other Ambulatory Visit: Payer: Self-pay

## 2022-01-16 DIAGNOSIS — J45909 Unspecified asthma, uncomplicated: Secondary | ICD-10-CM | POA: Insufficient documentation

## 2022-01-16 DIAGNOSIS — R0789 Other chest pain: Secondary | ICD-10-CM

## 2022-01-16 DIAGNOSIS — R0602 Shortness of breath: Secondary | ICD-10-CM | POA: Insufficient documentation

## 2022-01-16 DIAGNOSIS — R000 Tachycardia, unspecified: Secondary | ICD-10-CM | POA: Diagnosis not present

## 2022-01-16 DIAGNOSIS — Z79899 Other long term (current) drug therapy: Secondary | ICD-10-CM | POA: Diagnosis not present

## 2022-01-16 DIAGNOSIS — I10 Essential (primary) hypertension: Secondary | ICD-10-CM | POA: Diagnosis not present

## 2022-01-16 DIAGNOSIS — Z8616 Personal history of COVID-19: Secondary | ICD-10-CM | POA: Diagnosis not present

## 2022-01-16 HISTORY — DX: Essential (primary) hypertension: I10

## 2022-01-16 LAB — CBC
HCT: 42.4 % (ref 36.0–46.0)
Hemoglobin: 14.6 g/dL (ref 12.0–15.0)
MCH: 30.4 pg (ref 26.0–34.0)
MCHC: 34.4 g/dL (ref 30.0–36.0)
MCV: 88.3 fL (ref 80.0–100.0)
Platelets: 340 10*3/uL (ref 150–400)
RBC: 4.8 MIL/uL (ref 3.87–5.11)
RDW: 13.9 % (ref 11.5–15.5)
WBC: 4.6 10*3/uL (ref 4.0–10.5)
nRBC: 0 % (ref 0.0–0.2)

## 2022-01-16 LAB — BASIC METABOLIC PANEL
Anion gap: 14 (ref 5–15)
BUN: <5 mg/dL — ABNORMAL LOW (ref 6–20)
CO2: 28 mmol/L (ref 22–32)
Calcium: 9.4 mg/dL (ref 8.9–10.3)
Chloride: 95 mmol/L — ABNORMAL LOW (ref 98–111)
Creatinine, Ser: 0.71 mg/dL (ref 0.44–1.00)
GFR, Estimated: >60 mL/min (ref >60)
Glucose, Bld: 105 mg/dL — ABNORMAL HIGH (ref 70–99)
Potassium: 2.7 mmol/L — CL (ref 3.5–5.1)
Sodium: 137 mmol/L (ref 135–145)

## 2022-01-16 LAB — I-STAT BETA HCG BLOOD, ED (MC, WL, AP ONLY): I-stat hCG, quantitative: <5 m[IU]/mL (ref <5)

## 2022-01-16 LAB — MAGNESIUM: Magnesium: 1.5 mg/dL — ABNORMAL LOW (ref 1.7–2.4)

## 2022-01-16 LAB — TROPONIN I (HIGH SENSITIVITY)
Troponin I (High Sensitivity): 9 ng/L (ref <18)
Troponin I (High Sensitivity): 8 ng/L (ref <18)

## 2022-01-16 MED ORDER — AMLODIPINE BESYLATE 5 MG PO TABS
5.0000 mg | ORAL_TABLET | Freq: Once | ORAL | Status: AC
  Administered 2022-01-16: 5 mg via ORAL
  Filled 2022-01-16: qty 1

## 2022-01-16 MED ORDER — ONDANSETRON HCL 4 MG/2ML IJ SOLN
4.0000 mg | Freq: Once | INTRAMUSCULAR | Status: AC
  Administered 2022-01-16: 4 mg via INTRAVENOUS
  Filled 2022-01-16: qty 2

## 2022-01-16 MED ORDER — MORPHINE SULFATE (PF) 4 MG/ML IV SOLN
4.0000 mg | Freq: Once | INTRAVENOUS | Status: AC
  Administered 2022-01-16: 4 mg via INTRAVENOUS
  Filled 2022-01-16: qty 1

## 2022-01-16 MED ORDER — LORAZEPAM 2 MG/ML IJ SOLN
1.0000 mg | Freq: Once | INTRAMUSCULAR | Status: AC
  Administered 2022-01-16: 1 mg via INTRAVENOUS
  Filled 2022-01-16: qty 1

## 2022-01-16 MED ORDER — POTASSIUM CHLORIDE 10 MEQ/100ML IV SOLN
10.0000 meq | INTRAVENOUS | Status: AC
  Administered 2022-01-16 (×3): 10 meq via INTRAVENOUS
  Filled 2022-01-16 (×3): qty 100

## 2022-01-16 MED ORDER — ALBUTEROL SULFATE HFA 108 (90 BASE) MCG/ACT IN AERS: 1.0000 | INHALATION_SPRAY | Freq: Four times a day (QID) | RESPIRATORY_TRACT | 0 refills | Status: DC | PRN

## 2022-01-16 MED ORDER — POTASSIUM CHLORIDE CRYS ER 20 MEQ PO TBCR
40.0000 meq | EXTENDED_RELEASE_TABLET | Freq: Two times a day (BID) | ORAL | Status: DC
  Administered 2022-01-16: 40 meq via ORAL
  Filled 2022-01-16: qty 2

## 2022-01-16 MED ORDER — MAGNESIUM OXIDE -MG SUPPLEMENT 400 (240 MG) MG PO TABS
800.0000 mg | ORAL_TABLET | Freq: Once | ORAL | Status: AC
  Administered 2022-01-16: 800 mg via ORAL
  Filled 2022-01-16: qty 2

## 2022-01-16 MED ORDER — SODIUM CHLORIDE 0.9 % IV BOLUS
1000.0000 mL | Freq: Once | INTRAVENOUS | Status: AC
  Administered 2022-01-16: 1000 mL via INTRAVENOUS

## 2022-01-16 MED ORDER — POTASSIUM CHLORIDE CRYS ER 20 MEQ PO TBCR
40.0000 meq | EXTENDED_RELEASE_TABLET | Freq: Once | ORAL | Status: AC
  Administered 2022-01-16: 40 meq via ORAL
  Filled 2022-01-16: qty 2

## 2022-01-16 MED ORDER — NAPROXEN 500 MG PO TABS: 500.0000 mg | ORAL_TABLET | Freq: Two times a day (BID) | ORAL | 0 refills | Status: DC

## 2022-01-16 MED ORDER — KETOROLAC TROMETHAMINE 15 MG/ML IJ SOLN
15.0000 mg | Freq: Once | INTRAMUSCULAR | Status: AC
  Administered 2022-01-16: 15 mg via INTRAVENOUS
  Filled 2022-01-16: qty 1

## 2022-01-16 MED ORDER — ALBUTEROL SULFATE HFA 108 (90 BASE) MCG/ACT IN AERS: 1.0000 | INHALATION_SPRAY | Freq: Four times a day (QID) | RESPIRATORY_TRACT | 0 refills | Status: AC | PRN

## 2022-01-16 MED ORDER — HYDROCODONE-ACETAMINOPHEN 5-325 MG PO TABS: 2.0000 | ORAL_TABLET | ORAL | 0 refills | Status: DC | PRN

## 2022-01-16 MED ORDER — HYDRALAZINE HCL 20 MG/ML IJ SOLN
5.0000 mg | Freq: Once | INTRAMUSCULAR | Status: AC
  Administered 2022-01-16: 5 mg via INTRAVENOUS
  Filled 2022-01-16: qty 1

## 2022-01-16 MED ORDER — IOHEXOL 350 MG/ML SOLN
75.0000 mL | Freq: Once | INTRAVENOUS | Status: AC | PRN
  Administered 2022-01-16: 75 mL via INTRAVENOUS

## 2022-01-16 MED ORDER — POTASSIUM CHLORIDE CRYS ER 20 MEQ PO TBCR: 20.0000 meq | EXTENDED_RELEASE_TABLET | Freq: Two times a day (BID) | ORAL | 0 refills | Status: DC

## 2022-01-16 MED ORDER — POTASSIUM CHLORIDE CRYS ER 20 MEQ PO TBCR: 20.0000 meq | EXTENDED_RELEASE_TABLET | Freq: Two times a day (BID) | ORAL | 0 refills | Status: AC

## 2022-01-16 NOTE — Discharge Instructions (Addendum)
Your work-up today was overall reassuring.  CT scan did not show evidence of blood clot.  No concern for heart attack at this time.  Your chest pain could be musculoskeletal in nature such as costochondritis.  You received Toradol in the emergency room along with other pain medicine with some improvement in your symptoms.  I have sent naproxen into the pharmacy for you.  Your potassium was also low today.  I have sent potassium into your pharmacy.  You received potassium supplements in the emergency room as well.  Please follow-up with your primary care doctor to have your chemistry panel (potassium) repeated in about 3 days.  For any concerning symptoms such as worsening pain, shortness of breath, please return to the emergency room for evaluation. I also gave you a referral to cardiology.  They should give you a call either Monday or Tuesday.  If you do not hear from them please give their office a call to schedule this appointment. Your blood pressure is also elevated.  Please follow-up with your primary care provider to have this reevaluated to see if your medication needs any adjustments.

## 2022-01-16 NOTE — ED Provider Notes (Cosign Needed Addendum)
[facility] EMERGENCY DEPARTMENT Provider Note   CSN: [id number] Arrival date & time: 01/16/22  1026     History  No chief complaint on file.   Ann Mcmillan is a 36 y.o. female.  36 year old female with history of PE not currently on anticoagulation presents today for evaluation of chest pain, shortness of breath.  Chest pain is pleuritic.  States this feels similar to when she had her PE in the past.  Denies history of cardiac disease.  Symptoms started last night, and worsened this morning.  The history is provided by the patient. No language interpreter was used.       Home Medications Prior to Admission medications   Medication Sig Start Date End Date Taking? Authorizing Provider  acetaminophen (TYLENOL) 500 MG tablet Take 2 tablets (1,000 mg total) by mouth every 8 (eight) hours as needed. Patient taking differently: Take 1,000 mg by mouth every 8 (eight) hours as needed for headache (pain). 01/10/20   Elodia Florence., MD  HYDROcodone-acetaminophen (NORCO/VICODIN) 5-325 MG tablet Take 1 tablet by mouth every 4 (four) hours as needed. 07/23/20   Isla Pence, MD  phenazopyridine (PYRIDIUM) 200 MG tablet Take 1 tablet (200 mg total) by mouth 3 (three) times daily. 07/28/21   Deatra Canter, Shayli Altemose, PA-C  loratadine (CLARITIN) 10 MG tablet Take 1 tablet (10 mg total) by mouth daily. Patient not taking: Reported on 07/03/2017 07/28/16 05/03/19  Antonietta Breach, PA-C      Allergies    Patient has no known allergies.    Review of Systems   Review of Systems  Constitutional:  Negative for fever.  Respiratory:  Positive for shortness of breath.   Cardiovascular:  Positive for chest pain. Negative for palpitations and leg swelling.  All other systems reviewed and are negative.   Physical Exam Updated Vital Signs BP (!) 176/119 (BP Location: Right Arm)   Pulse 84   Temp 98.2 F (36.8 C) (Oral)   Resp 17   SpO2 100%  Physical Exam Vitals and nursing note reviewed.   Constitutional:      General: She is not in acute distress.    Appearance: Normal appearance. She is not ill-appearing.  HENT:     Head: Normocephalic and atraumatic.     Nose: Nose normal.  Eyes:     General: No scleral icterus.    Extraocular Movements: Extraocular movements intact.     Conjunctiva/sclera: Conjunctivae normal.  Cardiovascular:     Rate and Rhythm: Regular rhythm. Tachycardia present.     Pulses: Normal pulses.  Pulmonary:     Effort: Pulmonary effort is normal. No respiratory distress.     Breath sounds: Normal breath sounds. No wheezing or rales.  Abdominal:     General: There is no distension.     Palpations: Abdomen is soft.     Tenderness: There is no abdominal tenderness. There is no guarding.  Musculoskeletal:        General: Normal range of motion.     Cervical back: Normal range of motion.     Right lower leg: No edema.     Left lower leg: No edema.  Skin:    General: Skin is warm and dry.  Neurological:     General: No focal deficit present.     Mental Status: She is alert. Mental status is at baseline.     ED Results / Procedures / Treatments   Labs (all labs ordered are listed, but only abnormal results are  displayed) Labs Reviewed  BASIC METABOLIC PANEL - Abnormal; Notable for the following components:      Result Value   Potassium 2.7 (*)    Chloride 95 (*)    Glucose, Bld 105 (*)    BUN <5 (*)    All other components within normal limits  CBC  MAGNESIUM  I-STAT BETA HCG BLOOD, ED ([facility], [facility], [facility] ONLY)  TROPONIN I (HIGH SENSITIVITY)  TROPONIN I (HIGH SENSITIVITY)    EKG EKG Interpretation  Date/Time:  Friday January 16 2022 10:36:54 EDT Ventricular Rate:  117 PR Interval:  130 QRS Duration: 80 QT Interval:  362 QTC Calculation: 504 R Axis:   68 Text Interpretation: Sinus tachycardia Left ventricular hypertrophy with repolarization abnormality ( Sokolow-Lyon ) Abnormal ECG Prolonged QT Otherwise no significant change  Confirmed by Deno Etienne [phone]) on 01/16/2022 12:53:46 PM  Radiology DG Chest 2 View  Result Date: 01/16/2022 CLINICAL DATA:  Left sharp chest pain EXAM: CHEST - 2 VIEW COMPARISON:  Chest radiograph and CTA chest 07/23/2020 FINDINGS: The cardiomediastinal silhouette is normal. There is no focal consolidation or pulmonary edema. There is no pleural effusion or pneumothorax There is no acute osseous abnormality. IMPRESSION: No radiographic evidence of acute cardiopulmonary process. Electronically Signed   By: Valetta Mole M.D.   On: 01/16/2022 11:09    Procedures Procedures    Medications Ordered in ED Medications  sodium chloride 0.9 % bolus 1,000 mL (has no administration in time range)  potassium chloride 10 mEq in 100 mL IVPB (has no administration in time range)  potassium chloride SA (KLOR-CON M) CR tablet 40 mEq (has no administration in time range)  magnesium oxide (MAG-OX) tablet 800 mg (has no administration in time range)  morphine (PF) 4 MG/ML injection 4 mg (has no administration in time range)  ondansetron (ZOFRAN) injection 4 mg (has no administration in time range)  LORazepam (ATIVAN) injection 1 mg (has no administration in time range)    ED Course/ Medical Decision Making/ A&P Clinical Course as of 01/16/22 1750  Fri Jan 16, 2022  1614 Patient following pain medication reports improvement in symptoms and appears to be much more comfortable. [AA]    Clinical Course User Index [AA] Evlyn Courier, PA-C                           Medical Decision Making Amount and/or Complexity of Data Reviewed Labs: ordered. Radiology: ordered.  Risk OTC drugs. Prescription drug management.   Medical Decision Making / ED Course   This patient presents to the ED for concern of chest pain, this involves an extensive number of treatment options, and is a complaint that carries with it a high risk of complications and morbidity.  The differential diagnosis includes PE, ACS, MSK pain,  pneumonia  MDM: 36 year old female presents with above-mentioned place.  Does have history of PE not currently on anticoagulation.  Will evaluate with cardiac work-up as well as PE study.  PE study is negative for acute PE.  Patient did have URI symptoms about a week ago.  She also states this pain is not new however this is the worst that its been.  Significant relief following pain medication.  CBC without leukocytosis or anemia.  Troponin negative x2.  BMP with potassium 2.7 otherwise without acute concern.  Magnesium 1.5.  Potassium repleted in the emergency room with 2 rounds of potassium 40 mg p.o., and 30 mg IVP.  Discharged with 7-day  potassium supplement.  Discussed importance of follow-up with PCP to have this repeated in the next 3 days.  Discussed follow-up with PCP for further work-up and management of chest pain.  Given significant family cardiac history I did provide her with cardiology referral.  Patient voices understanding and is in agreement with plan.  Return precautions discussed.  We will start patient on naproxen as she had improvement with Toradol.  Chest pain is reproducible and worsened with certain movements.  Likely costochondritis or other MSK etiology. Hypokalemia secondary to GI losses.  She denies any abdominal pain.  And abdomen is benign on exam.  She states certain things to make her vomit such as mention of unpleasant foods.  She remained without nausea or vomiting throughout her emergency room stay.  Without leukocytosis or fever.  Doubt infection.  Lab Tests: -I ordered, reviewed, and interpreted labs.   The pertinent results include:   Labs Reviewed  BASIC METABOLIC PANEL - Abnormal; Notable for the following components:      Result Value   Potassium 2.7 (*)    Chloride 95 (*)    Glucose, Bld 105 (*)    BUN <5 (*)    All other components within normal limits  CBC  MAGNESIUM  I-STAT BETA HCG BLOOD, ED ([facility], [facility], [facility] ONLY)  TROPONIN I (HIGH SENSITIVITY)   TROPONIN I (HIGH SENSITIVITY)      EKG  EKG Interpretation  Date/Time:  Friday January 16 2022 10:36:54 EDT Ventricular Rate:  117 PR Interval:  130 QRS Duration: 80 QT Interval:  362 QTC Calculation: 504 R Axis:   68 Text Interpretation: Sinus tachycardia Left ventricular hypertrophy with repolarization abnormality ( Sokolow-Lyon ) Abnormal ECG Prolonged QT Otherwise no significant change Confirmed by Melene Plan [phone]) on 01/16/2022 12:53:46 PM         Imaging Studies ordered: I ordered imaging studies including CTA chest, chest x-ray I independently visualized and interpreted imaging. I agree with the radiologist interpretation   Medicines ordered and prescription drug management: Meds ordered this encounter  Medications   sodium chloride 0.9 % bolus 1,000 mL   potassium chloride 10 mEq in 100 mL IVPB   potassium chloride SA (KLOR-CON M) CR tablet 40 mEq   magnesium oxide (MAG-OX) tablet 800 mg   morphine (PF) 4 MG/ML injection 4 mg   ondansetron (ZOFRAN) injection 4 mg   LORazepam (ATIVAN) injection 1 mg    -I have reviewed the patients home medicines and have made adjustments as needed  Cardiac Monitoring: The patient was maintained on a cardiac monitor.  I personally viewed and interpreted the cardiac monitored which showed an underlying rhythm of: Initially sinus tach, improved to normal sinus rhythm  Reevaluation: After the interventions noted above, I reevaluated the patient and found that they have :improved  Co morbidities that complicate the patient evaluation  Past Medical History:  Diagnosis Date   Asthma    COVID-19    Hypertension    Pulmonary emboli (HCC) 01/04/2020      Dispostion: Patient is appropriate for discharge.  Discharged in stable condition.  No indication for further work-up or admission at this time.  Pain improved.  Did provide her with short course of pain medication to keep on hand for severe pain.  Strict return  precautions discussed. BP elevated during emergency room stay.  Likely secondary to pain.  Home BP medication provided.  No signs of endorgan damage.  Appropriate for discharge and follow-up with outpatient.  Discussed keeping  blood pressure diary.   Final Clinical Impression(s) / ED Diagnoses Final diagnoses:  Atypical chest pain    Rx / DC Orders ED Discharge Orders          Ordered    Ambulatory referral to Cardiology       Comments: If you have not heard from the Cardiology office within the next 72 hours please call [phone].   01/16/22 1738    albuterol (VENTOLIN HFA) 108 (90 Base) MCG/ACT inhaler  Every 6 hours PRN,   Status:  Discontinued        01/16/22 1738    potassium chloride SA (KLOR-CON M) 20 MEQ tablet  2 times daily,   Status:  Discontinued        01/16/22 1738    naproxen (NAPROSYN) 500 MG tablet  2 times daily,   Status:  Discontinued        01/16/22 1738    albuterol (VENTOLIN HFA) 108 (90 Base) MCG/ACT inhaler  Every 6 hours PRN        01/16/22 1807    naproxen (NAPROSYN) 500 MG tablet  2 times daily        01/16/22 1807    potassium chloride SA (KLOR-CON M) 20 MEQ tablet  2 times daily        01/16/22 1807    HYDROcodone-acetaminophen (NORCO/VICODIN) 5-325 MG tablet  Every 4 hours PRN        01/16/22 1807              Evlyn Courier, PA-C 01/16/22 1811    Evlyn Courier, PA-C 01/16/22 [place], [place], DO 01/17/22 [phone]

## 2022-01-16 NOTE — ED Notes (Addendum)
Lab called with critical potassium of 2.7. Notified Dr. Tyrone Nine

## 2022-01-16 NOTE — ED Provider Triage Note (Signed)
Emergency Medicine Provider Triage Evaluation Note  Ann Mcmillan , a 37 y.o. female  was evaluated in triage.  Pt complains of left-sided sharp chest pain since last night.  She does have history of PE not currently on anticoagulation.  States this feels similar to when she had a PE.  Without hypoxia.  Will obtain CTA chest.  Review of Systems  Positive: As above Negative: As above  Physical Exam  BP (!) 182/123   Pulse 97   Temp 98.2 F (36.8 C) (Oral)   Resp 20   SpO2 94%  Gen:   Awake, no distress   Resp:  Normal effort  MSK:   Moves extremities without difficulty  Other:    Medical Decision Making  Medically screening exam initiated at 10:49 AM.  Appropriate orders placed.  Ann Mcmillan was informed that the remainder of the evaluation will be completed by another provider, this initial triage assessment does not replace that evaluation, and the importance of remaining in the ED until their evaluation is complete.     Evlyn Courier, PA-C 01/16/22 1049

## 2022-01-16 NOTE — ED Notes (Signed)
Pt ambulated to restroom without incident.

## 2022-01-16 NOTE — ED Notes (Signed)
Pt requested if there is a program that provided blood pressure equipment for free. RN reaching out to social work

## 2022-01-16 NOTE — ED Triage Notes (Signed)
Patient complains of 5 days of cough, congestion and chills. Complains of body aches with same and right arm pain yesterday. Complains of CP with cough and worse with inspiration and movement

## 2022-01-30 ENCOUNTER — Encounter: Payer: Self-pay | Admitting: *Deleted

## 2022-04-07 IMAGING — CT CT ANGIO CHEST
2 of 6 series · 18 of 36 positions shown · IV contrast (omnipaque)
Comparison: 01/04/2020

CLINICAL DATA: Pulmonary embolus suspected with high probability.
Diagnosed with multiple pulmonary emboli 2 weeks ago. Now with acute
worsening of left-sided chest pain.

EXAM:
CT ANGIOGRAPHY CHEST WITH CONTRAST
TECHNIQUE: Multidetector CT imaging of the chest was performed using the
standard protocol during bolus administration of intravenous
contrast. Multiplanar CT image reconstructions and MIPs were
obtained to evaluate the vascular anatomy.
CONTRAST:  100mL OMNIPAQUE IOHEXOL 350 MG/ML SOLN

[Series 5: thins · axial · 0.70mm/px · z∈[-220,-12]mm · 17 of 236 slices shown]
[im 14/236  lung]
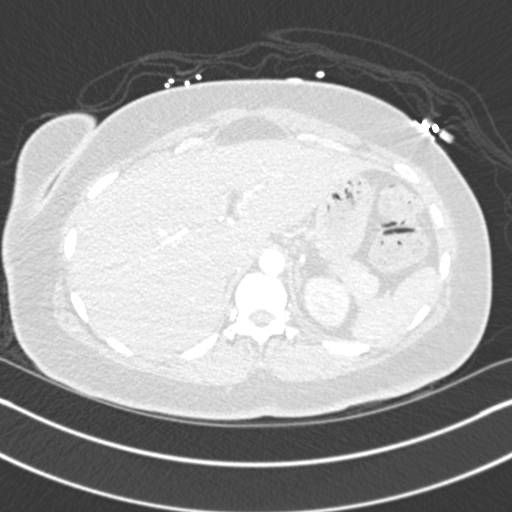
[im 27/236  mediastinal]
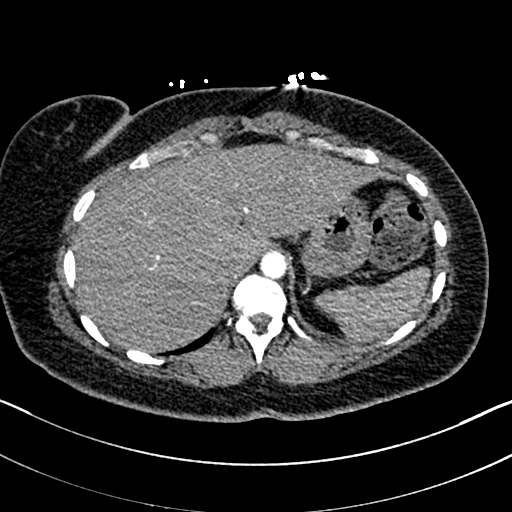
[im 40/236  lung]
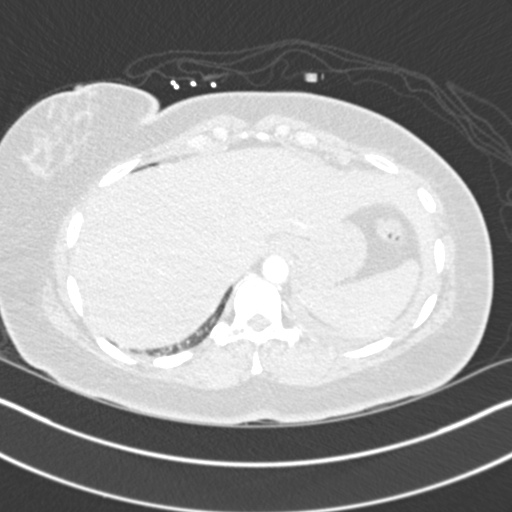
[im 53/236  mediastinal]
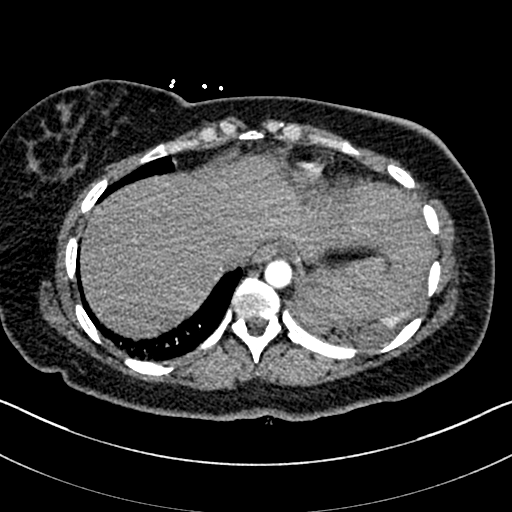
[im 66/236  lung]
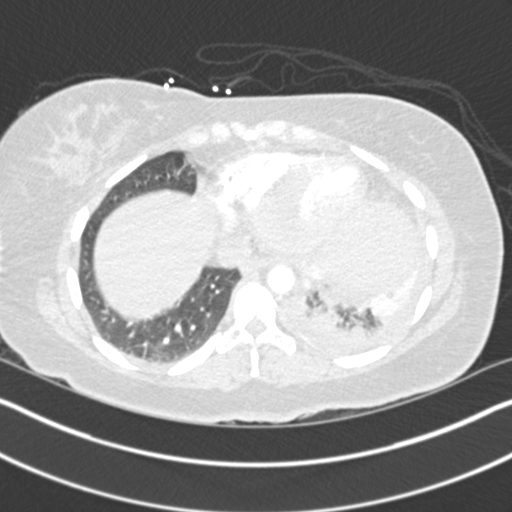
[im 79/236  mediastinal]
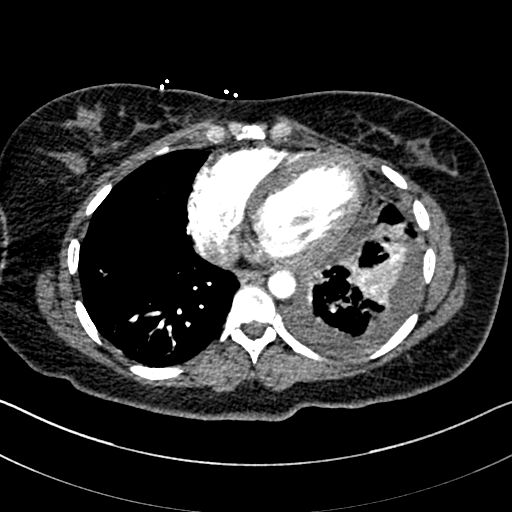
[im 92/236  lung]
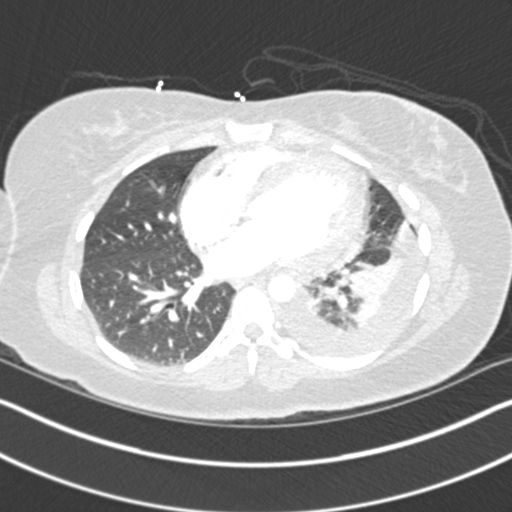
[im 105/236  mediastinal]
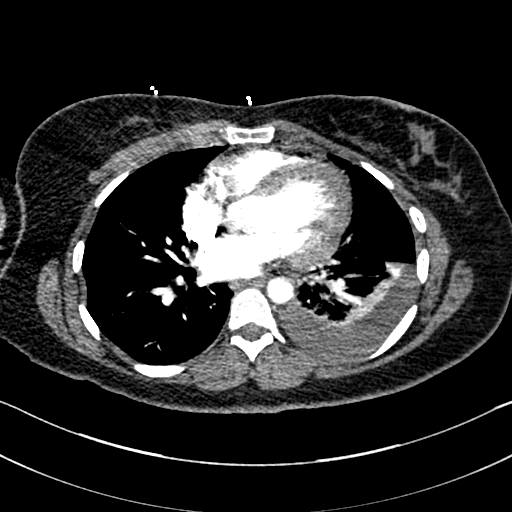
[im 118/236  lung]
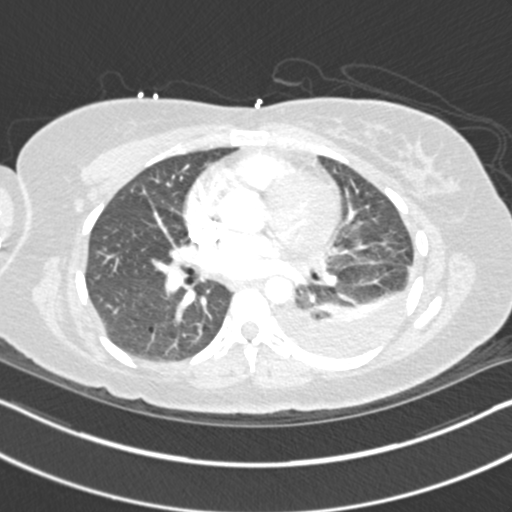
[im 131/236  mediastinal]
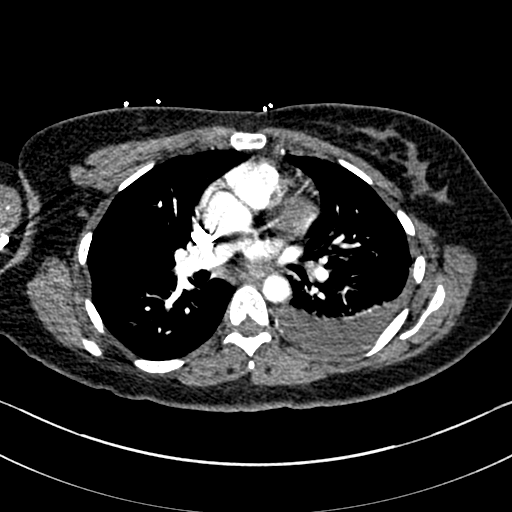
[im 144/236  lung]
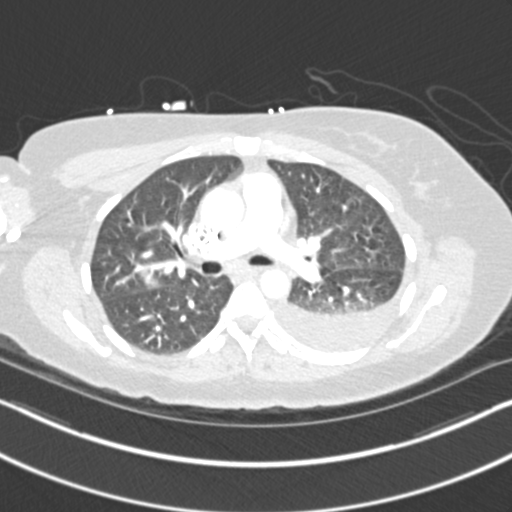
[im 157/236  mediastinal]
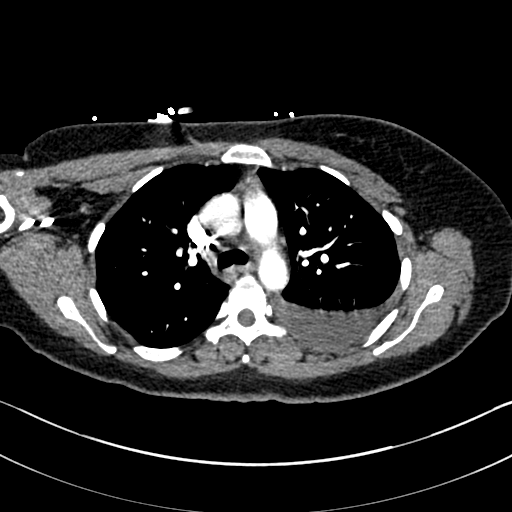
[im 170/236  lung]
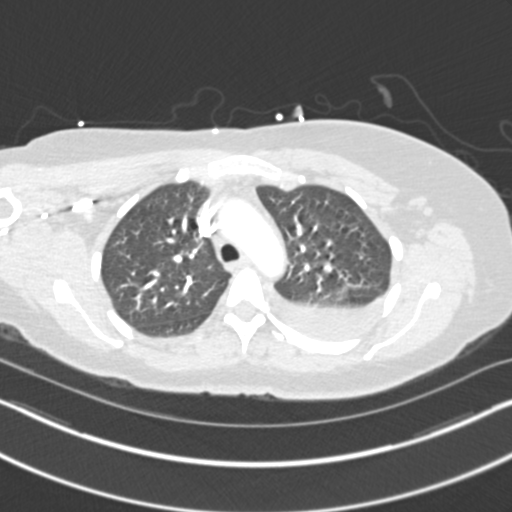
[im 183/236  mediastinal]
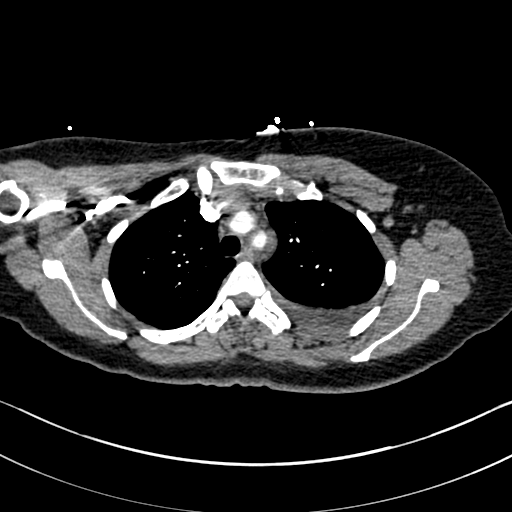
[im 196/236  lung]
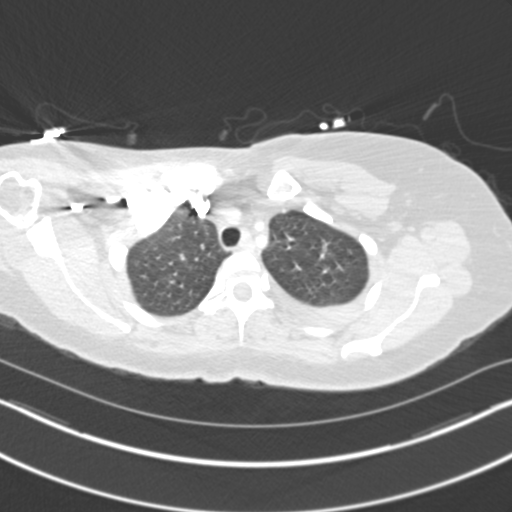
[im 209/236  mediastinal]
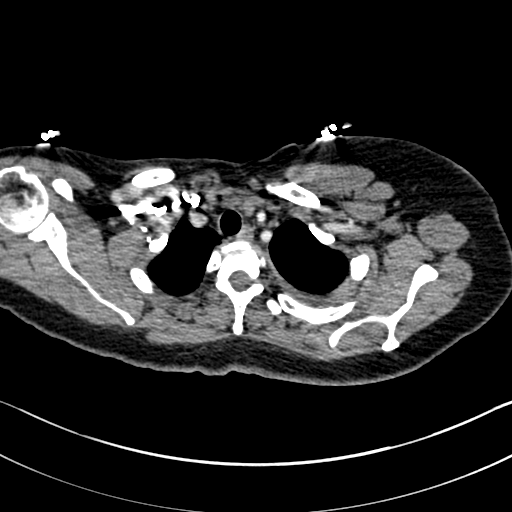
[im 222/236  lung]
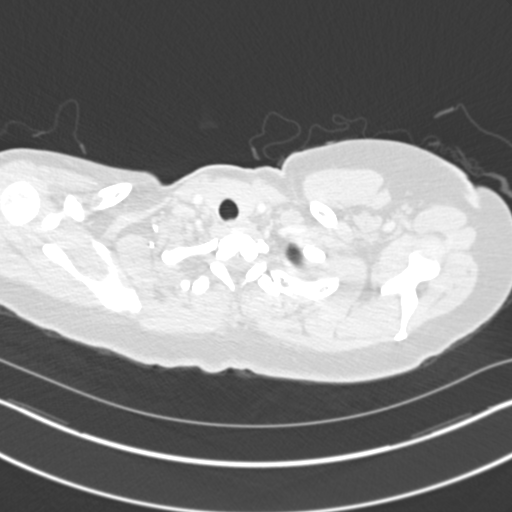

[Series 7: coronal mpr · coronal · 0.47mm/px · 1 of 119 slices shown]
[im 60/119  mediastinal]
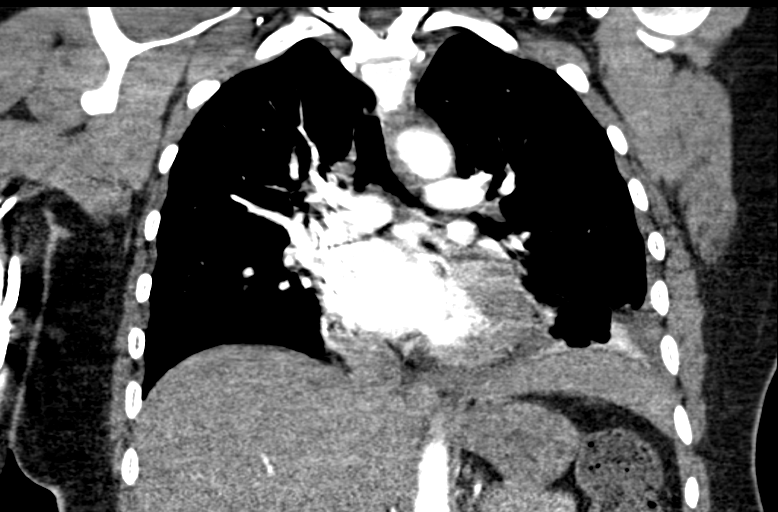

[18 of 36 positions shown; findings below may reference images not displayed]

FINDINGS: Cardiovascular: Motion and streak artifact limits the technical
quality of the examination. There is moderately good opacification
of the central and segmental pulmonary arteries. Possible residual
peripheral emboli in the left lung base although less prominent than
on the prior study and difficult to see due to consolidation.
Otherwise, no residual or recurrent filling defects are identified.
Changes are consistent with good response to interval therapy.

Normal heart size. No pericardial effusions. Normal caliber thoracic
aorta. No aortic dissection.

Mediastinum/Nodes: Esophagus is decompressed. No significant
lymphadenopathy in the chest.

Lungs/Pleura: Consolidation and atelectasis in the left lung base.
This could represent pulmonary infarct or pneumonia. Centrally
obstructing process is not excluded and follow-up to resolution is
recommended. Moderate left pleural effusion. Consolidation and
effusion are increased since previous study. Right lung is clear.

Upper Abdomen: No acute process demonstrated in the visualized upper
abdomen.

Musculoskeletal: No chest wall abnormality. No acute or significant
osseous findings.

Review of the MIP images confirms the above findings.
IMPRESSION: 1. Motion and streak artifact limits the technical quality of the
examination. Possible residual peripheral emboli in the left lung
base although less prominent than on the prior study and difficult
to see due to consolidation. Otherwise, no residual or recurrent
filling defects are identified. Changes are consistent with good
response to interval therapy.
2. Consolidation and atelectasis in the left lung base. This could
represent pulmonary infarct or pneumonia. Centrally obstructing
process is not excluded and follow-up to resolution is recommended.
3. Moderate left pleural effusion, increased since previous study.

## 2022-04-26 ENCOUNTER — Emergency Department (HOSPITAL_COMMUNITY)
Admission: EM | Admit: 2022-04-26 | Discharge: 2022-04-26 | Disposition: A | Discharge status: Home / Self Care | Payer: Medicaid Other | Attending: Emergency Medicine | Admitting: Emergency Medicine

## 2022-04-26 ENCOUNTER — Other Ambulatory Visit: Payer: Self-pay

## 2022-04-26 DIAGNOSIS — F17200 Nicotine dependence, unspecified, uncomplicated: Secondary | ICD-10-CM | POA: Insufficient documentation

## 2022-04-26 DIAGNOSIS — H60392 Other infective otitis externa, left ear: Secondary | ICD-10-CM | POA: Insufficient documentation

## 2022-04-26 DIAGNOSIS — H9202 Otalgia, left ear: Secondary | ICD-10-CM | POA: Diagnosis present

## 2022-04-26 DIAGNOSIS — I10 Essential (primary) hypertension: Secondary | ICD-10-CM | POA: Insufficient documentation

## 2022-04-26 DIAGNOSIS — J45909 Unspecified asthma, uncomplicated: Secondary | ICD-10-CM | POA: Insufficient documentation

## 2022-04-26 DIAGNOSIS — Z20822 Contact with and (suspected) exposure to covid-19: Secondary | ICD-10-CM | POA: Insufficient documentation

## 2022-04-26 LAB — RESP PANEL BY RT-PCR (RSV, FLU A&B, COVID)  RVPGX2
Influenza A by PCR: NEGATIVE
Influenza B by PCR: NEGATIVE
Resp Syncytial Virus by PCR: NEGATIVE
SARS Coronavirus 2 by RT PCR: NEGATIVE

## 2022-04-26 LAB — COMPREHENSIVE METABOLIC PANEL
ALT: 19 U/L (ref 0–44)
AST: 31 U/L (ref 15–41)
Albumin: 3.4 g/dL — ABNORMAL LOW (ref 3.5–5.0)
Alkaline Phosphatase: 75 U/L (ref 38–126)
Anion gap: 15 (ref 5–15)
BUN: 5 mg/dL — ABNORMAL LOW (ref 6–20)
CO2: 23 mmol/L (ref 22–32)
Calcium: 8.8 mg/dL — ABNORMAL LOW (ref 8.9–10.3)
Chloride: 100 mmol/L (ref 98–111)
Creatinine, Ser: 0.54 mg/dL (ref 0.44–1.00)
GFR, Estimated: >60 mL/min (ref >60)
Glucose, Bld: 92 mg/dL (ref 70–99)
Potassium: 3.5 mmol/L (ref 3.5–5.1)
Sodium: 138 mmol/L (ref 135–145)
Total Bilirubin: 0.2 mg/dL — ABNORMAL LOW (ref 0.3–1.2)
Total Protein: 7.5 g/dL (ref 6.5–8.1)

## 2022-04-26 LAB — CBC
HCT: 40.6 % (ref 36.0–46.0)
Hemoglobin: 13.9 g/dL (ref 12.0–15.0)
MCH: 29.6 pg (ref 26.0–34.0)
MCHC: 34.2 g/dL (ref 30.0–36.0)
MCV: 86.4 fL (ref 80.0–100.0)
Platelets: 300 10*3/uL (ref 150–400)
RBC: 4.7 MIL/uL (ref 3.87–5.11)
RDW: 16.1 % — ABNORMAL HIGH (ref 11.5–15.5)
WBC: 7.2 10*3/uL (ref 4.0–10.5)
nRBC: 0 % (ref 0.0–0.2)

## 2022-04-26 MED ORDER — OFLOXACIN 0.3 % OT SOLN: 5.0000 [drp] | Freq: Two times a day (BID) | OTIC | 0 refills | Status: AC

## 2022-04-26 MED ORDER — OXYCODONE-ACETAMINOPHEN 5-325 MG PO TABS
1.0000 | ORAL_TABLET | Freq: Once | ORAL | Status: AC
  Administered 2022-04-26: 1 via ORAL
  Filled 2022-04-26: qty 1

## 2022-04-26 NOTE — ED Notes (Signed)
Pt blood pressure was 181/123 Md and PA was made aware

## 2022-04-26 NOTE — ED Triage Notes (Signed)
Patient reports persistent left ear pain extending to left face for 6 days , denies injury , no bleeding or drainage . Denies fever or chills .

## 2022-04-26 NOTE — ED Provider Notes (Signed)
[facility]ERG[facility]Y DEPARTMENT Provider Note   CSN: [id number] Arrival date & time: 04/26/22  0124     History  Chief Complaint  Patient presents with   Otalgia    Yazmen Briones is a 37 y.o. female with history of pulmonary embolus, asthma, hypertension, tobacco dependence who presents the emergency department complaining of left ear pain.  Patient states pain has been going on, and extending to the left side of her face for the past 6 days or so.  She has been trying to clean out her ear using Q-tips.  She denies any fever, chills, bleeding or drainage.  Denies any cough or congestion.  HPI     Home Medications Prior to Admission medications   Medication Sig Start Date End Date Taking? Authorizing Provider  ofloxacin (FLOXIN) 0.3 % OTIC solution Place 5 drops into the left ear 2 (two) times daily for 7 days. 04/26/22 05/03/22 Yes Ilee Randleman T, PA-C  albuterol (VENTOLIN HFA) 108 (90 Base) MCG/ACT inhaler Inhale 1-2 puffs into the lungs every 6 (six) hours as needed for wheezing or shortness of breath. 01/16/22   Evlyn Courier, PA-C  HYDROcodone-acetaminophen (NORCO/VICODIN) 5-325 MG tablet Take 2 tablets by mouth every 4 (four) hours as needed. 01/16/22   Evlyn Courier, PA-C  ibuprofen (ADVIL) 200 MG tablet Take 800 mg by mouth daily as needed for headache or mild pain.    [provider]  naproxen (NAPROSYN) 500 MG tablet Take 1 tablet (500 mg total) by mouth 2 (two) times daily. 01/16/22   Deatra Canter, [name], PA-C  Phenyleph-CPM-DM-APAP (ALKA-SELTZER PLUS COLD & FLU PO) Take 2 capsules by mouth 2 (two) times daily as needed (cold symptoms).    [provider]  potassium chloride SA (KLOR-CON M) 20 MEQ tablet Take 1 tablet (20 mEq total) by mouth 2 (two) times daily for 7 days. 01/16/22 01/23/22  Evlyn Courier, PA-C  loratadine (CLARITIN) 10 MG tablet Take 1 tablet (10 mg total) by mouth daily. Patient not taking: Reported on 07/03/2017 07/28/16 05/03/19  Antonietta Breach,  PA-C      Allergies    Patient has no known allergies.    Review of Systems   Review of Systems  HENT:  Positive for ear pain.   All other systems reviewed and are negative.   Physical Exam Updated Vital Signs BP (!) 188/147 (BP Location: Right Arm)   Pulse (!) 105   Temp 98.1 F (36.7 C) (Oral)   Resp 12   SpO2 98%  Physical Exam Vitals and nursing note reviewed.  Constitutional:      Appearance: Normal appearance.  HENT:     Head: Normocephalic and atraumatic.     Right Ear: Tympanic membrane, ear canal and external ear normal.     Left Ear: Tympanic membrane normal. Swelling and tenderness present. No mastoid tenderness.  Eyes:     Conjunctiva/sclera: Conjunctivae normal.  Pulmonary:     Effort: Pulmonary effort is normal. No respiratory distress.  Skin:    General: Skin is warm and dry.  Neurological:     Mental Status: She is alert.  Psychiatric:        Mood and Affect: Mood normal.        Behavior: Behavior normal.     ED Results / Procedures / Treatments   Labs (all labs ordered are listed, but only abnormal results are displayed) Labs Reviewed  CBC - Abnormal; Notable for the following components:      Result Value  RDW 16.1 (*)    All other components within normal limits  COMPREHENSIVE METABOLIC PANEL - Abnormal; Notable for the following components:   BUN 5 (*)    Calcium 8.8 (*)    Albumin 3.4 (*)    Total Bilirubin 0.2 (*)    All other components within normal limits  RESP PANEL BY RT-PCR (RSV, FLU A&B, COVID)  RVPGX2    EKG None  Radiology No results found.  Procedures Procedures    Medications Ordered in ED Medications  oxyCODONE-acetaminophen (PERCOCET/ROXICET) 5-325 MG per tablet 1 tablet (1 tablet Oral Given 04/26/22 2585)    ED Course/ Medical Decision Making/ A&P                           Medical Decision Making Risk Prescription drug management.   Patient is a 37 year old female with a history of pulmonary embolus,  asthma, hypertension, tobacco dependence presents the emergency department complaining of left ear pain for the past week.  Differential diagnosis: Otitis externa, otitis media, barotrauma, foreign body  On exam patient is hypertensive, history of similar, she has not been taking her medication.  Right ear normal.  Left tympanic membrane normal, with canal swelling and tenderness.  No manipulation of the auricle.  No mastoid tenderness.  Labs obtained from triage including CBC, CMP, respiratory panel.  All grossly unremarkable.  Exam is consistent with otitis externa.  Suspect likely from using Q-tips to clean the ear.  Will discharge with antibiotic eardrops.  Patient has a follow-up appointment with her primary doctor in 2 days. Recommended patient continue her antihypertensives.  While she is quite hypertensive, she is adamant pain is coming from her ear and not a headache.  No vision changes.  Low concern for hypertensive urgency or emergency.  Final Clinical Impression(s) / ED Diagnoses Final diagnoses:  Other infective acute otitis externa of left ear    Rx / DC Orders ED Discharge Orders          Ordered    ofloxacin (FLOXIN) 0.3 % OTIC solution  2 times daily        04/26/22 0842           Portions of this report may have been transcribed using voice recognition software. Every effort was made to ensure accuracy; however, inadvertent computerized transcription errors may be present.    Jeanella Flattery 04/26/22 2778    Wynetta Fines, MD 04/26/22 [phone]

## 2022-04-26 NOTE — ED Provider Triage Note (Signed)
Emergency Medicine Provider Triage Evaluation Note  Ann Mcmillan , a 37 y.o. female  was evaluated in triage.  Pt complains of left ear pain. She states it has been severely painful for 6 days. She states she has no medical problems but on further review it does appear that she has a history of asthma, pulmonary emboli, hypertension  She states she has been off anticoagulation for 9 months she states that she was taken off this by medical providers and it seems that her PE was thought to be associated with COVID-19  She denies any chest pain or difficulty breathing.    Review of Systems  Positive: Left ear pain Negative: Fever  Physical Exam  BP (!) 188/147 (BP Location: Right Arm)   Pulse (!) 105   Temp 98.1 F (36.7 C) (Oral)   Resp 12   SpO2 98%  Gen:   Awake, no distress   Resp:  Normal effort  MSK:   Moves extremities without difficulty  Other:  Bizarre somewhat erratic behavior.  Was asleep on my entry to the room however then became nearly tearful when talking about her left ear.  Left EAC with tiny area of macerated tissue but no obvious abnormal findings apart from this.  Medical Decision Making  Medically screening exam initiated at 3:05 AM.  Appropriate orders placed.  Ann Mcmillan was informed that the remainder of the evaluation will be completed by another provider, this initial triage assessment does not replace that evaluation, and the importance of remaining in the ED until their evaluation is complete.  Patient is tachycardic, extremely hypertensive with blood pressure measured by me--due to initial elevated reading--at 200/140  She states she has had elevated blood pressures in the past she states that she "does not need it "when asked about whether she takes medicine still.  Denies any recreational drug use.  States that she does drink "a lot ".  Patient is a somewhat difficult historian, somewhat bizarre, extremely hypertensive and somewhat tachycardic with heart  rates as high as 120 here in the room.  She is not an appropriate candidate for discharge from triage.  Will check basic labs -- given hypertension.    Ann Mcmillan [place], [place] 04/26/22 [phone]

## 2022-04-26 NOTE — Discharge Instructions (Signed)
You were seen in the emergency department today for ear pain.  As we discussed I think you have an outer ear infection.  These can progress to middle ear infections.  Have given you some antibiotic eardrops, but I want you to keep with your doctors appointment on Tuesday for reevaluation.  You can use ibuprofen or Tylenol as needed for pain.

## 2022-05-05 DIAGNOSIS — H9202 Otalgia, left ear: Secondary | ICD-10-CM | POA: Diagnosis not present

## 2022-05-05 DIAGNOSIS — R519 Headache, unspecified: Secondary | ICD-10-CM | POA: Insufficient documentation

## 2022-05-05 DIAGNOSIS — Z5321 Procedure and treatment not carried out due to patient leaving prior to being seen by health care provider: Secondary | ICD-10-CM | POA: Insufficient documentation

## 2022-05-06 ENCOUNTER — Encounter (HOSPITAL_COMMUNITY): Payer: Self-pay | Admitting: Emergency Medicine

## 2022-05-06 ENCOUNTER — Other Ambulatory Visit: Payer: Self-pay

## 2022-05-06 ENCOUNTER — Emergency Department (HOSPITAL_COMMUNITY)
Admission: EM | Admit: 2022-05-06 | Discharge: 2022-05-06 | Discharge status: Against Medical Advice | Payer: Medicaid Other | Attending: Emergency Medicine | Admitting: Emergency Medicine

## 2022-05-06 MED ORDER — OXYCODONE-ACETAMINOPHEN 5-325 MG PO TABS
1.0000 | ORAL_TABLET | Freq: Once | ORAL | Status: AC
  Administered 2022-05-06: 1 via ORAL
  Filled 2022-05-06: qty 1

## 2022-05-06 NOTE — ED Notes (Signed)
Patient called by sort NT and this RN numerous times with no answer.

## 2022-05-06 NOTE — ED Triage Notes (Signed)
Pt c/o ear ache for approx 2 weeks. Pt seen for the same approx 8 days ago and prescribed ofloxacin - took full course and still c/o of pain of left ear that has spread to pain in left cheek/eye.

## 2022-05-06 NOTE — ED Notes (Signed)
Pt wwas called x3 no answer

## 2022-05-06 NOTE — ED Notes (Signed)
Pt called 2x no answer 

## 2022-05-06 NOTE — ED Provider Triage Note (Signed)
Emergency Medicine Provider Triage Evaluation Note  Ann Mcmillan , a 37 y.o. female  was evaluated in triage.  Pt complains of L ear pain, headache.  Sx for weeks. Using ofloxacin currently.   Review of Systems  Positive: Ear pain Negative: Fever   Physical Exam  BP (!) 190/126   Pulse (!) 104   Temp 98.3 F (36.8 C) (Oral)   Resp 16   Ht 5\' 2"  (1.575 m)   Wt 71.7 kg   SpO2 91%   BMI 28.90 kg/m  Gen:   Awake, no distress   Resp:  Normal effort  MSK:   Moves extremities without difficulty  Other:    Medical Decision Making  Medically screening exam initiated at 12:55 AM.  Appropriate orders placed.  Ann Mcmillan was informed that the remainder of the evaluation will be completed by another provider, this initial triage assessment does not replace that evaluation, and the importance of remaining in the ED until their evaluation is complete.     Ann Mcmillan, [place] 05/06/22 [id number]

## 2022-06-09 ENCOUNTER — Emergency Department (HOSPITAL_COMMUNITY)
Admission: EM | Admit: 2022-06-09 | Discharge: 2022-06-09 | Disposition: A | Discharge status: Home / Self Care | Payer: Medicaid Other | Attending: Emergency Medicine | Admitting: Emergency Medicine

## 2022-06-09 ENCOUNTER — Other Ambulatory Visit: Payer: Self-pay

## 2022-06-09 ENCOUNTER — Encounter (HOSPITAL_COMMUNITY): Payer: Self-pay

## 2022-06-09 DIAGNOSIS — I10 Essential (primary) hypertension: Secondary | ICD-10-CM | POA: Diagnosis not present

## 2022-06-09 DIAGNOSIS — R030 Elevated blood-pressure reading, without diagnosis of hypertension: Secondary | ICD-10-CM | POA: Diagnosis not present

## 2022-06-09 DIAGNOSIS — H6692 Otitis media, unspecified, left ear: Secondary | ICD-10-CM | POA: Diagnosis not present

## 2022-06-09 DIAGNOSIS — H9202 Otalgia, left ear: Secondary | ICD-10-CM | POA: Diagnosis present

## 2022-06-09 DIAGNOSIS — H6092 Unspecified otitis externa, left ear: Secondary | ICD-10-CM | POA: Diagnosis not present

## 2022-06-09 DIAGNOSIS — H60502 Unspecified acute noninfective otitis externa, left ear: Secondary | ICD-10-CM

## 2022-06-09 MED ORDER — OFLOXACIN 0.3 % OT SOLN: 5.0000 [drp] | Freq: Two times a day (BID) | OTIC | 0 refills | Status: AC

## 2022-06-09 MED ORDER — METHYLPREDNISOLONE 4 MG PO TBPK: ORAL_TABLET | ORAL | 0 refills | Status: DC

## 2022-06-09 MED ORDER — AMOXICILLIN-POT CLAVULANATE 875-125 MG PO TABS: 1.0000 | ORAL_TABLET | Freq: Two times a day (BID) | ORAL | 0 refills | Status: DC

## 2022-06-09 MED ORDER — NAPROXEN 375 MG PO TABS: 375.0000 mg | ORAL_TABLET | Freq: Two times a day (BID) | ORAL | 0 refills | Status: DC

## 2022-06-09 NOTE — ED Notes (Signed)
Found pt was in restroom

## 2022-06-09 NOTE — Discharge Instructions (Signed)
Get help right away if: You have severe pain that is not controlled with medicine. You have swelling, redness, or pain around your ear. You have stiffness in your neck. A part of your face is not moving (paralyzed). The bone behind your ear (mastoid bone) is tender when you touch it. You develop a severe headache. 

## 2022-06-09 NOTE — ED Provider Notes (Signed)
[facility] Provider Note   CSN: [id number] Arrival date & time: 06/09/22  1553     History  Chief Complaint  Patient presents with   Lt Ear ache    Ann Mcmillan is a 37 y.o. female who presents emergency department with severe left ear pain.  This has been an ongoing problem for several weeks.  She was seen in the past and treated with eardrops however has progressively worsening pain in the ear.  She has been unable to follow-up with ENT.  She denies mastoid tenderness, jaw pain, dental pain, severe headache or neck stiffness.  HPI     Home Medications Prior to Admission medications   Medication Sig Start Date End Date Taking? Authorizing Provider  albuterol (VENTOLIN HFA) 108 (90 Base) MCG/ACT inhaler Inhale 1-2 puffs into the lungs every 6 (six) hours as needed for wheezing or shortness of breath. 01/16/22   Evlyn Courier, PA-C  HYDROcodone-acetaminophen (NORCO/VICODIN) 5-325 MG tablet Take 2 tablets by mouth every 4 (four) hours as needed. 01/16/22   Evlyn Courier, PA-C  ibuprofen (ADVIL) 200 MG tablet Take 800 mg by mouth daily as needed for headache or mild pain.    [provider]  naproxen (NAPROSYN) 500 MG tablet Take 1 tablet (500 mg total) by mouth 2 (two) times daily. 01/16/22   Deatra Canter, [name], PA-C  Phenyleph-CPM-DM-APAP (ALKA-SELTZER PLUS COLD & FLU PO) Take 2 capsules by mouth 2 (two) times daily as needed (cold symptoms).    [provider]  potassium chloride SA (KLOR-CON M) 20 MEQ tablet Take 1 tablet (20 mEq total) by mouth 2 (two) times daily for 7 days. 01/16/22 01/23/22  Evlyn Courier, PA-C  loratadine (CLARITIN) 10 MG tablet Take 1 tablet (10 mg total) by mouth daily. Patient not taking: Reported on 07/03/2017 07/28/16 05/03/19  Antonietta Breach, PA-C      Allergies    Patient has no known allergies.    Review of Systems   Review of Systems  Physical Exam Updated Vital Signs BP (!) 195/110 (BP Location: Right Arm)    Pulse (!) 107   Temp 97.6 F (36.4 C)   Resp 16   Ht 5' 2"$  (1.575 m)   Wt 76.2 kg   SpO2 98%   BMI 30.73 kg/m  Physical Exam Vitals and nursing note reviewed.  Constitutional:      General: She is not in acute distress.    Appearance: She is well-developed. She is not diaphoretic.  HENT:     Head: Normocephalic and atraumatic.     Right Ear: Tympanic membrane and external ear normal.     Left Ear: External ear normal.     Ears:     Comments: Swelling in the left ear canal, left TM retracted with effusion behind the TM.  Pain with movement of the pinna    Nose: Nose normal.     Mouth/Throat:     Mouth: Mucous membranes are moist.  Eyes:     General: No scleral icterus.    Conjunctiva/sclera: Conjunctivae normal.  Cardiovascular:     Rate and Rhythm: Normal rate and regular rhythm.     Heart sounds: Normal heart sounds. No murmur heard.    No friction rub. No gallop.  Pulmonary:     Effort: Pulmonary effort is normal. No respiratory distress.     Breath sounds: Normal breath sounds.  Abdominal:     General: Bowel sounds are normal. There is no distension.  Palpations: Abdomen is soft. There is no mass.     Tenderness: There is no abdominal tenderness. There is no guarding.  Musculoskeletal:     Cervical back: Normal range of motion.  Skin:    General: Skin is warm and dry.  Neurological:     Mental Status: She is alert and oriented to person, place, and time.  Psychiatric:        Behavior: Behavior normal.     ED Results / Procedures / Treatments   Labs (all labs ordered are listed, but only abnormal results are displayed) Labs Reviewed - No data to display  EKG None  Radiology No results found.  Procedures Procedures    Medications Ordered in ED Medications - No data to display  ED Course/ Medical Decision Making/ A&P                             Medical Decision Making Patient with earache.  Appears to have both otitis externa and otitis  media.  Will treat with ofloxacin, Augmentin, Medrol taper and anti-inflammatories.  No signs of mastoiditis.  Will have the patient follow-up with ENT.  Discussed outpatient follow-up and return precautions  Risk Prescription drug management.           Final Clinical Impression(s) / ED Diagnoses Final diagnoses:  None    Rx / DC Orders ED Discharge Orders     None         Margarita Mail, PA-C 06/09/22 2159    Valarie Merino, MD 06/09/22 2240

## 2022-06-09 NOTE — ED Triage Notes (Signed)
Pt came in via POV d/t a Lt sided earache that she was seen for 1 month ago & was given ABTs, she finished the med & reports it has gotten worse. Denies fevers, endorses sore throat & painful swallowing, states that around her ear feels swollen & hurts now too. A/Ox4, 10/10 pain.

## 2022-06-09 NOTE — ED Notes (Signed)
Called 3 timers and unable to find pt in lobby./

## 2022-06-29 ENCOUNTER — Encounter (HOSPITAL_COMMUNITY): Payer: Self-pay | Admitting: Emergency Medicine

## 2022-06-29 ENCOUNTER — Emergency Department (HOSPITAL_COMMUNITY): Payer: Medicaid Other

## 2022-06-29 ENCOUNTER — Other Ambulatory Visit: Payer: Self-pay

## 2022-06-29 ENCOUNTER — Emergency Department (HOSPITAL_COMMUNITY)
Admission: EM | Admit: 2022-06-29 | Discharge: 2022-06-29 | Disposition: A | Discharge status: Home / Self Care | Payer: Medicaid Other | Attending: Emergency Medicine | Admitting: Emergency Medicine

## 2022-06-29 DIAGNOSIS — R079 Chest pain, unspecified: Secondary | ICD-10-CM | POA: Insufficient documentation

## 2022-06-29 DIAGNOSIS — J45909 Unspecified asthma, uncomplicated: Secondary | ICD-10-CM | POA: Insufficient documentation

## 2022-06-29 DIAGNOSIS — Z8616 Personal history of COVID-19: Secondary | ICD-10-CM | POA: Insufficient documentation

## 2022-06-29 DIAGNOSIS — R0789 Other chest pain: Secondary | ICD-10-CM | POA: Diagnosis not present

## 2022-06-29 DIAGNOSIS — Z86711 Personal history of pulmonary embolism: Secondary | ICD-10-CM | POA: Insufficient documentation

## 2022-06-29 DIAGNOSIS — I10 Essential (primary) hypertension: Secondary | ICD-10-CM | POA: Insufficient documentation

## 2022-06-29 DIAGNOSIS — M549 Dorsalgia, unspecified: Secondary | ICD-10-CM | POA: Insufficient documentation

## 2022-06-29 DIAGNOSIS — R000 Tachycardia, unspecified: Secondary | ICD-10-CM | POA: Insufficient documentation

## 2022-06-29 LAB — BASIC METABOLIC PANEL
Anion gap: 10 (ref 5–15)
BUN: 8 mg/dL (ref 6–20)
CO2: 23 mmol/L (ref 22–32)
Calcium: 8.9 mg/dL (ref 8.9–10.3)
Chloride: 101 mmol/L (ref 98–111)
Creatinine, Ser: 0.56 mg/dL (ref 0.44–1.00)
GFR, Estimated: >60 mL/min (ref >60)
Glucose, Bld: 101 mg/dL — ABNORMAL HIGH (ref 70–99)
Potassium: 3.1 mmol/L — ABNORMAL LOW (ref 3.5–5.1)
Sodium: 134 mmol/L — ABNORMAL LOW (ref 135–145)

## 2022-06-29 LAB — D-DIMER, QUANTITATIVE: D-Dimer, Quant: 0.45 ug/mL-FEU (ref 0.00–0.50)

## 2022-06-29 LAB — TROPONIN I (HIGH SENSITIVITY): Troponin I (High Sensitivity): 8 ng/L (ref <18)

## 2022-06-29 LAB — I-STAT BETA HCG BLOOD, ED (MC, WL, AP ONLY): I-stat hCG, quantitative: 6.5 m[IU]/mL — ABNORMAL HIGH (ref <5)

## 2022-06-29 MED ORDER — METHOCARBAMOL 500 MG PO TABS: 500.0000 mg | ORAL_TABLET | Freq: Three times a day (TID) | ORAL | 0 refills | Status: DC | PRN

## 2022-06-29 MED ORDER — KETOROLAC TROMETHAMINE 15 MG/ML IJ SOLN
15.0000 mg | Freq: Once | INTRAMUSCULAR | Status: AC
  Administered 2022-06-29: 15 mg via INTRAVENOUS
  Filled 2022-06-29: qty 1

## 2022-06-29 NOTE — Discharge Instructions (Addendum)
Your blood pressure was elevated will need to be followed.  Hopefully the muscle relaxers can help with the back pain.

## 2022-06-29 NOTE — ED Provider Notes (Signed)
[facility] Provider Note   CSN: [id number] Arrival date & time: 06/29/22  [id number]     History {Add pertinent medical, surgical, social history, OB history to HPI:1} Chief Complaint  Patient presents with   Chest Pain   Back Pain    Ann Mcmillan is a 37 y.o. female.   Chest Pain Associated symptoms: back pain   Back Pain Associated symptoms: chest pain   Patient presents with chest and back pain.  Began a around a week ago.  Has come and gone.  Has been using ointments and Motrin without relief.  Goes in anterior chest.  Worse with certain movements.  Also goes to the back.  States now the redness is there but thinks it is a reaction to the medicine.  Some shortness of breath.  Does have history of pulmonary embolisms.  Not currently on anticoagulation.  Pulmonary embolism was when she had COVID previously.  No swelling in her legs.    Past Medical History:  Diagnosis Date   Asthma    COVID-19    Hypertension    Pulmonary emboli ([place]) 01/04/2020    Home Medications Prior to Admission medications   Medication Sig Start Date End Date Taking? Authorizing Provider  albuterol (VENTOLIN HFA) 108 (90 Base) MCG/ACT inhaler Inhale 1-2 puffs into the lungs every 6 (six) hours as needed for wheezing or shortness of breath. 01/16/22   Deatra Canter, [name], PA-C  amoxicillin-clavulanate (AUGMENTIN) 875-125 MG tablet Take 1 tablet by mouth every 12 (twelve) hours. 06/09/22   Margarita Mail, PA-C  HYDROcodone-acetaminophen (NORCO/VICODIN) 5-325 MG tablet Take 2 tablets by mouth every 4 (four) hours as needed. 01/16/22   Evlyn Courier, PA-C  ibuprofen (ADVIL) 200 MG tablet Take 800 mg by mouth daily as needed for headache or mild pain.    [provider]  methylPREDNISolone (MEDROL DOSEPAK) 4 MG TBPK tablet Use as directed 06/09/22   Margarita Mail, PA-C  naproxen (NAPROSYN) 375 MG tablet Take 1 tablet (375 mg total) by mouth 2 (two) times daily with a  meal. 06/09/22   [name], [name], PA-C  naproxen (NAPROSYN) 500 MG tablet Take 1 tablet (500 mg total) by mouth 2 (two) times daily. 01/16/22   Deatra Canter, [name], PA-C  Phenyleph-CPM-DM-APAP (ALKA-SELTZER PLUS COLD & FLU PO) Take 2 capsules by mouth 2 (two) times daily as needed (cold symptoms).    [provider]  potassium chloride SA (KLOR-CON M) 20 MEQ tablet Take 1 tablet (20 mEq total) by mouth 2 (two) times daily for 7 days. 01/16/22 01/23/22  Evlyn Courier, PA-C  loratadine (CLARITIN) 10 MG tablet Take 1 tablet (10 mg total) by mouth daily. Patient not taking: Reported on 07/03/2017 07/28/16 05/03/19  Antonietta Breach, PA-C      Allergies    Patient has no known allergies.    Review of Systems   Review of Systems  Cardiovascular:  Positive for chest pain.  Musculoskeletal:  Positive for back pain.    Physical Exam Updated Vital Signs BP (!) 196/119   Pulse (!) 101   Temp (!) 97 F (36.1 C)   Resp (!) 25   SpO2 97%  Physical Exam Vitals and nursing note reviewed.  Cardiovascular:     Rate and Rhythm: Tachycardia present.     Heart sounds: No murmur heard. Pulmonary:     Breath sounds: No wheezing, rhonchi or rales.  Chest:     Comments: Some tenderness to anterior chest and somewhat posterior left-sided  chest.  There is some erythema on the left upper chest and left posterior upper back.  Not vesicular. Abdominal:     Tenderness: There is no abdominal tenderness.  Musculoskeletal:     Right lower leg: No edema.     Left lower leg: No edema.  Skin:    General: Skin is warm.  Neurological:     Mental Status: She is alert.     ED Results / Procedures / Treatments   Labs (all labs ordered are listed, but only abnormal results are displayed) Labs Reviewed  D-DIMER, QUANTITATIVE  BASIC METABOLIC PANEL  I-STAT BETA HCG BLOOD, ED ([facility], [facility], [facility] ONLY)  TROPONIN I (HIGH SENSITIVITY)    EKG None  Radiology No results found.  Procedures Procedures  {Document cardiac  monitor, telemetry assessment procedure when appropriate:1}  Medications Ordered in ED Medications - No data to display  ED Course/ Medical Decision Making/ A&P   {   Click here for ABCD2, HEART and other calculatorsREFRESH Note before signing :1}                          Medical Decision Making Amount and/or Complexity of Data Reviewed Labs: ordered. Radiology: ordered.   Patient with chest pain.  History of pulmonary embolisms.  Worse with certain movements.  Differential diagnosis includes musculoskeletal chest pain, cardiac cause, pulm embolism, pneumonia.  Will get x-ray and blood work.  I think patient is low enough risk for pulmonary embolism to get a D-dimer.  Also hypertension but could be related to pain.  {Document critical care time when appropriate:1} {Document review of labs and clinical decision tools ie heart score, Chads2Vasc2 etc:1}  {Document your independent review of radiology images, and any outside records:1} {Document your discussion with family members, caretakers, and with consultants:1} {Document social determinants of health affecting pt's care:1} {Document your decision making why or why not admission, treatments were needed:1} Final Clinical Impression(s) / ED Diagnoses Final diagnoses:  None    Rx / DC Orders ED Discharge Orders     None

## 2022-06-29 NOTE — ED Triage Notes (Signed)
Pt here from home with c/o left sided back shoulder and chest pain , pain has been off and on for 1 week with some slight sob, has a hx of PE ,

## 2022-11-21 ENCOUNTER — Emergency Department (HOSPITAL_BASED_OUTPATIENT_CLINIC_OR_DEPARTMENT_OTHER): Payer: Medicaid Other

## 2022-11-21 ENCOUNTER — Other Ambulatory Visit: Payer: Self-pay

## 2022-11-21 ENCOUNTER — Emergency Department (HOSPITAL_BASED_OUTPATIENT_CLINIC_OR_DEPARTMENT_OTHER)
Admission: EM | Admit: 2022-11-21 | Discharge: 2022-11-21 | Disposition: A | Discharge status: Home / Self Care | Payer: Medicaid Other | Attending: Emergency Medicine | Admitting: Emergency Medicine

## 2022-11-21 ENCOUNTER — Encounter (HOSPITAL_BASED_OUTPATIENT_CLINIC_OR_DEPARTMENT_OTHER): Payer: Self-pay

## 2022-11-21 DIAGNOSIS — S43401A Unspecified sprain of right shoulder joint, initial encounter: Secondary | ICD-10-CM | POA: Diagnosis not present

## 2022-11-21 DIAGNOSIS — I10 Essential (primary) hypertension: Secondary | ICD-10-CM | POA: Diagnosis not present

## 2022-11-21 DIAGNOSIS — W19XXXA Unspecified fall, initial encounter: Secondary | ICD-10-CM | POA: Insufficient documentation

## 2022-11-21 DIAGNOSIS — J45909 Unspecified asthma, uncomplicated: Secondary | ICD-10-CM | POA: Diagnosis not present

## 2022-11-21 DIAGNOSIS — Z79899 Other long term (current) drug therapy: Secondary | ICD-10-CM | POA: Diagnosis not present

## 2022-11-21 DIAGNOSIS — I159 Secondary hypertension, unspecified: Secondary | ICD-10-CM | POA: Insufficient documentation

## 2022-11-21 DIAGNOSIS — M25511 Pain in right shoulder: Secondary | ICD-10-CM | POA: Diagnosis not present

## 2022-11-21 MED ORDER — KETOROLAC TROMETHAMINE 60 MG/2ML IM SOLN
60.0000 mg | Freq: Once | INTRAMUSCULAR | Status: AC
  Administered 2022-11-21: 60 mg via INTRAMUSCULAR
  Filled 2022-11-21: qty 2

## 2022-11-21 MED ORDER — CYCLOBENZAPRINE HCL 10 MG PO TABS: 10.0000 mg | ORAL_TABLET | Freq: Two times a day (BID) | ORAL | 0 refills | Status: DC | PRN

## 2022-11-21 MED ORDER — AMLODIPINE BESYLATE 5 MG PO TABS
5.0000 mg | ORAL_TABLET | Freq: Once | ORAL | Status: AC
  Administered 2022-11-21: 5 mg via ORAL
  Filled 2022-11-21: qty 1

## 2022-11-21 MED ORDER — AMLODIPINE BESYLATE 5 MG PO TABS: 5.0000 mg | ORAL_TABLET | Freq: Every day | ORAL | 0 refills | Status: DC

## 2022-11-21 MED ORDER — CYCLOBENZAPRINE HCL 10 MG PO TABS
10.0000 mg | ORAL_TABLET | Freq: Once | ORAL | Status: AC
  Administered 2022-11-21: 10 mg via ORAL
  Filled 2022-11-21: qty 1

## 2022-11-21 NOTE — ED Provider Notes (Signed)
[facility] Provider Note   CSN: [id number] Arrival date & time: 11/21/22  1227     History  Chief Complaint  Patient presents with   Fall   Shoulder Pain   Hypertension    Ann Mcmillan is a 37 y.o. female.  Patient here with right shoulder pain after fall few days ago.  Pain worse with movement.  Did not hit her head or lose consciousness.  Denies any chest pain or shortness of breath or weakness numbness or tingling.  Denies any abdominal pain.  No neck pain.  History of hypertension, asthma.  She has been taking OTCs without much help.  Blood pressure has been high recently.  Denies any chest pain or stroke symptoms.  The history is provided by the patient.       Home Medications Prior to Admission medications   Medication Sig Start Date End Date Taking? Authorizing Provider  amLODipine (NORVASC) 5 MG tablet Take 1 tablet (5 mg total) by mouth daily. 11/21/22 12/21/22 Yes , , DO  cyclobenzaprine (FLEXERIL) 10 MG tablet Take 1 tablet (10 mg total) by mouth 2 (two) times daily as needed for muscle spasms. 11/21/22  Yes , , DO  albuterol (VENTOLIN HFA) 108 (90 Base) MCG/ACT inhaler Inhale 1-2 puffs into the lungs every 6 (six) hours as needed for wheezing or shortness of breath. 01/16/22   Ann Mcmillan, [name], PA-C  amoxicillin-clavulanate (AUGMENTIN) 875-125 MG tablet Take 1 tablet by mouth every 12 (twelve) hours. 06/09/22   [name], PA-C  HYDROcodone-acetaminophen (NORCO/VICODIN) 5-325 MG tablet Take 2 tablets by mouth every 4 (four) hours as needed. 01/16/22   Ann [place], PA-C  ibuprofen (ADVIL) 200 MG tablet Take 800 mg by mouth daily as needed for headache or mild pain.    [provider]  methocarbamol (ROBAXIN) 500 MG tablet Take 1 tablet (500 mg total) by mouth every 8 (eight) hours as needed for muscle spasms. 06/29/22   Benjiman Core, MD  methylPREDNISolone (MEDROL DOSEPAK) 4 MG TBPK tablet Use as  directed 06/09/22   [name], PA-C  naproxen (NAPROSYN) 375 MG tablet Take 1 tablet (375 mg total) by mouth 2 (two) times daily with a meal. 06/09/22   [name], [name], PA-C  naproxen (NAPROSYN) 500 MG tablet Take 1 tablet (500 mg total) by mouth 2 (two) times daily. 01/16/22   Ann Mcmillan, [name], PA-C  Phenyleph-CPM-DM-APAP (ALKA-SELTZER PLUS COLD & FLU PO) Take 2 capsules by mouth 2 (two) times daily as needed (cold symptoms).    [provider]  potassium chloride SA (KLOR-CON M) 20 MEQ tablet Take 1 tablet (20 mEq total) by mouth 2 (two) times daily for 7 days. 01/16/22 01/23/22  Ann [place], PA-C  loratadine (CLARITIN) 10 MG tablet Take 1 tablet (10 mg total) by mouth daily. Patient not taking: Reported on 07/03/2017 07/28/16 05/03/19  Ann Madura, PA-C      Allergies    Patient has no known allergies.    Review of Systems   Review of Systems  Physical Exam Updated Vital Signs BP (!) 207/135   Pulse 86   Temp 97.9 F (36.6 C) (Oral)   Resp 18   Ht 5\' 2"  (1.575 m)   Wt 74.8 kg   LMP 10/06/2022 (Exact Date)   SpO2 100%   BMI 30.18 kg/m  Physical Exam Vitals and nursing note reviewed.  Constitutional:      General: She is not in acute distress.    Appearance: She  is well-developed.  HENT:     Head: Normocephalic and atraumatic.     Nose: Nose normal.     Mouth/Throat:     Mouth: Mucous membranes are moist.  Eyes:     Extraocular Movements: Extraocular movements intact.     Conjunctiva/sclera: Conjunctivae normal.     Pupils: Pupils are equal, round, and reactive to light.  Cardiovascular:     Rate and Rhythm: Normal rate and regular rhythm.     Pulses: Normal pulses.     Heart sounds: Normal heart sounds. No murmur heard. Pulmonary:     Effort: Pulmonary effort is normal. No respiratory distress.     Breath sounds: Normal breath sounds.  Abdominal:     Palpations: Abdomen is soft.     Tenderness: There is no abdominal tenderness.  Musculoskeletal:         General: Tenderness (TTP to right shoulder) present. No swelling. Normal range of motion.     Cervical back: Normal range of motion and neck supple.  Skin:    General: Skin is warm and dry.     Capillary Refill: Capillary refill takes less than 2 seconds.  Neurological:     General: No focal deficit present.     Mental Status: She is alert and oriented to person, place, and time.     Cranial Nerves: No cranial nerve deficit.     Sensory: No sensory deficit.     Motor: No weakness.  Psychiatric:        Mood and Affect: Mood normal.     ED Results / Procedures / Treatments   Labs (all labs ordered are listed, but only abnormal results are displayed) Labs Reviewed - No data to display  EKG None  Radiology DG Shoulder Right  Result Date: 11/21/2022 CLINICAL DATA:  Right shoulder pain since fall 4 days ago. EXAM: RIGHT SHOULDER - 2+ VIEW COMPARISON:  None Available. FINDINGS: There is no evidence of fracture or dislocation. There is no evidence of arthropathy or other focal bone abnormality. Soft tissues are unremarkable. IMPRESSION: Negative. Electronically Signed   By: Obie Dredge M.D.   On: 11/21/2022 13:16    Procedures Procedures    Medications Ordered in ED Medications  ketorolac (TORADOL) injection 60 mg (has no administration in time range)  cyclobenzaprine (FLEXERIL) tablet 10 mg (has no administration in time range)  amLODipine (NORVASC) tablet 5 mg (has no administration in time range)    ED Course/ Medical Decision Making/ A&P                                 Medical Decision Making Amount and/or Complexity of Data Reviewed Radiology: ordered.  Risk Prescription drug management.   Ann Mcmillan is here with right shoulder pain after fall few days ago.  She is hypertensive but otherwise normal vitals.  She is uncomfortable on exam which is likely driving her elevated blood pressure but on review of her blood pressure at recent visits they are consistently  elevated.  Will start her on amlodipine.  Does not have any stroke symptoms or chest pain.  Have no concern for any emergency related to her blood pressure.  This is likely asymptomatic.  Will start her on amlodipine.  X-ray showed no fracture or malalignment per my review interpretation.  I suspect she is having muscle spasms, contusion, sprain.  She is neurovascular neuromuscular intact otherwise.  No other pain elsewhere.  No midline spinal pain.  No headache.  Will place in a sling.  Given Flexeril and Toradol.  Will have her follow-up with orthopedics.  Discharged in good condition.  Understands return precautions.  This chart was dictated using voice recognition software.  Despite best efforts to proofread,  errors can occur which can change the documentation meaning.         Final Clinical Impression(s) / ED Diagnoses Final diagnoses:  Sprain of right shoulder, unspecified shoulder sprain type, initial encounter  Secondary hypertension    Rx / DC Orders ED Discharge Orders          Ordered    amLODipine (NORVASC) 5 MG tablet  Daily        11/21/22 1559    cyclobenzaprine (FLEXERIL) 10 MG tablet  2 times daily PRN        11/21/22 1559              , , DO 11/21/22 1601

## 2022-11-21 NOTE — ED Triage Notes (Signed)
Patient stated she fell Wednesday and hurt her right shoulder. She stated the pain is getting worse. She denied any other injury.

## 2022-11-21 NOTE — Discharge Instructions (Addendum)
Use sling for comfort . take Flexeril as prescribed.  This medication is mildly sedating as it is a muscle relaxant.  Do not drive or do dangerous activities while taking this medicine.  Recommend continued use of Tylenol 1000 mg every 6 hours as needed for pain.  Recommend 400 mg ibuprofen every 8 hours as needed for pain.  Recommend ice.  Follow-up with orthopedics.  Follow-up with your primary care doctor for your blood pressure.  I have started you on amlodipine 5 mg for this as well.

## 2023-01-29 ENCOUNTER — Emergency Department (HOSPITAL_BASED_OUTPATIENT_CLINIC_OR_DEPARTMENT_OTHER): Payer: Medicaid Other

## 2023-01-29 ENCOUNTER — Other Ambulatory Visit: Payer: Self-pay

## 2023-01-29 ENCOUNTER — Encounter (HOSPITAL_BASED_OUTPATIENT_CLINIC_OR_DEPARTMENT_OTHER): Payer: Self-pay | Admitting: Emergency Medicine

## 2023-01-29 ENCOUNTER — Emergency Department (HOSPITAL_BASED_OUTPATIENT_CLINIC_OR_DEPARTMENT_OTHER)
Admission: EM | Admit: 2023-01-29 | Discharge: 2023-01-30 | Disposition: A | Discharge status: Home / Self Care | Payer: Medicaid Other | Attending: Emergency Medicine | Admitting: Emergency Medicine

## 2023-01-29 DIAGNOSIS — R7989 Other specified abnormal findings of blood chemistry: Secondary | ICD-10-CM | POA: Diagnosis not present

## 2023-01-29 DIAGNOSIS — R0602 Shortness of breath: Secondary | ICD-10-CM

## 2023-01-29 DIAGNOSIS — Z7901 Long term (current) use of anticoagulants: Secondary | ICD-10-CM | POA: Insufficient documentation

## 2023-01-29 DIAGNOSIS — R000 Tachycardia, unspecified: Secondary | ICD-10-CM | POA: Diagnosis not present

## 2023-01-29 DIAGNOSIS — E876 Hypokalemia: Secondary | ICD-10-CM | POA: Diagnosis not present

## 2023-01-29 DIAGNOSIS — R079 Chest pain, unspecified: Secondary | ICD-10-CM

## 2023-01-29 DIAGNOSIS — R112 Nausea with vomiting, unspecified: Secondary | ICD-10-CM | POA: Diagnosis not present

## 2023-01-29 DIAGNOSIS — F419 Anxiety disorder, unspecified: Secondary | ICD-10-CM | POA: Diagnosis not present

## 2023-01-29 DIAGNOSIS — R197 Diarrhea, unspecified: Secondary | ICD-10-CM | POA: Insufficient documentation

## 2023-01-29 DIAGNOSIS — I10 Essential (primary) hypertension: Secondary | ICD-10-CM | POA: Insufficient documentation

## 2023-01-29 DIAGNOSIS — J45909 Unspecified asthma, uncomplicated: Secondary | ICD-10-CM | POA: Diagnosis not present

## 2023-01-29 LAB — D-DIMER, QUANTITATIVE: D-Dimer, Quant: 0.76 ug{FEU}/mL — ABNORMAL HIGH (ref 0.00–0.50)

## 2023-01-29 LAB — URINALYSIS, ROUTINE W REFLEX MICROSCOPIC
Bilirubin Urine: NEGATIVE
Glucose, UA: NEGATIVE mg/dL
Hgb urine dipstick: NEGATIVE
Ketones, ur: NEGATIVE mg/dL
Leukocytes,Ua: NEGATIVE
Nitrite: NEGATIVE
Protein, ur: NEGATIVE mg/dL
Specific Gravity, Urine: 1.02 (ref 1.005–1.030)
pH: 8.5 — ABNORMAL HIGH (ref 5.0–8.0)

## 2023-01-29 LAB — COMPREHENSIVE METABOLIC PANEL
ALT: 35 U/L (ref 0–44)
AST: 101 U/L — ABNORMAL HIGH (ref 15–41)
Albumin: 3.9 g/dL (ref 3.5–5.0)
Alkaline Phosphatase: 93 U/L (ref 38–126)
Anion gap: 18 — ABNORMAL HIGH (ref 5–15)
BUN: 5 mg/dL — ABNORMAL LOW (ref 6–20)
CO2: 23 mmol/L (ref 22–32)
Calcium: 9.1 mg/dL (ref 8.9–10.3)
Chloride: 93 mmol/L — ABNORMAL LOW (ref 98–111)
Creatinine, Ser: 0.76 mg/dL (ref 0.44–1.00)
GFR, Estimated: >60 mL/min (ref >60)
Glucose, Bld: 113 mg/dL — ABNORMAL HIGH (ref 70–99)
Potassium: 3 mmol/L — ABNORMAL LOW (ref 3.5–5.1)
Sodium: 134 mmol/L — ABNORMAL LOW (ref 135–145)
Total Bilirubin: 0.3 mg/dL (ref 0.3–1.2)
Total Protein: 8.7 g/dL — ABNORMAL HIGH (ref 6.5–8.1)

## 2023-01-29 LAB — CBC
HCT: 39.8 % (ref 36.0–46.0)
Hemoglobin: 14.1 g/dL (ref 12.0–15.0)
MCH: 32.2 pg (ref 26.0–34.0)
MCHC: 35.4 g/dL (ref 30.0–36.0)
MCV: 90.9 fL (ref 80.0–100.0)
Platelets: 333 10*3/uL (ref 150–400)
RBC: 4.38 MIL/uL (ref 3.87–5.11)
RDW: 12.2 % (ref 11.5–15.5)
WBC: 5.8 10*3/uL (ref 4.0–10.5)
nRBC: 0 % (ref 0.0–0.2)

## 2023-01-29 LAB — TROPONIN I (HIGH SENSITIVITY): Troponin I (High Sensitivity): 12 ng/L (ref <18)

## 2023-01-29 LAB — LIPASE, BLOOD: Lipase: 24 U/L (ref 11–51)

## 2023-01-29 LAB — HCG, SERUM, QUALITATIVE: Preg, Serum: NEGATIVE

## 2023-01-29 LAB — MAGNESIUM: Magnesium: 1.5 mg/dL — ABNORMAL LOW (ref 1.7–2.4)

## 2023-01-29 MED ORDER — LACTATED RINGERS IV BOLUS
1000.0000 mL | Freq: Once | INTRAVENOUS | Status: AC
  Administered 2023-01-29: 1000 mL via INTRAVENOUS

## 2023-01-29 MED ORDER — MAGNESIUM SULFATE 2 GM/50ML IV SOLN
2.0000 g | Freq: Once | INTRAVENOUS | Status: AC
  Administered 2023-01-29: 2 g via INTRAVENOUS
  Filled 2023-01-29: qty 50

## 2023-01-29 MED ORDER — IOHEXOL 350 MG/ML SOLN
75.0000 mL | Freq: Once | INTRAVENOUS | Status: AC | PRN
  Administered 2023-01-29: 75 mL via INTRAVENOUS

## 2023-01-29 MED ORDER — HYDRALAZINE HCL 25 MG PO TABS
25.0000 mg | ORAL_TABLET | ORAL | Status: AC
  Administered 2023-01-29: 25 mg via ORAL
  Filled 2023-01-29: qty 1

## 2023-01-29 MED ORDER — POTASSIUM CHLORIDE CRYS ER 20 MEQ PO TBCR
40.0000 meq | EXTENDED_RELEASE_TABLET | ORAL | Status: AC
  Administered 2023-01-29: 40 meq via ORAL
  Filled 2023-01-29: qty 2

## 2023-01-29 MED ORDER — LORAZEPAM 2 MG/ML IJ SOLN
1.0000 mg | Freq: Once | INTRAMUSCULAR | Status: AC
  Administered 2023-01-29: 1 mg via INTRAVENOUS
  Filled 2023-01-29: qty 1

## 2023-01-29 MED ORDER — ONDANSETRON HCL 4 MG/2ML IJ SOLN: 4.0000 mg | Freq: Once | INTRAMUSCULAR | Status: DC | PRN

## 2023-01-29 NOTE — ED Provider Notes (Signed)
[place] [facility] AT [facility] Provider Note   CSN: [id number] Arrival date & time: 01/29/23  2048     History  Chief Complaint  Patient presents with   Weakness   Emesis    Ann Mcmillan is a 37 y.o. female.  37 year old female with a history of asthma, hypertension, and PE provoked by COVID not currently on anticoagulation who presents emergency department with nausea, vomiting, diarrhea, anxiety, and chest pain and shortness of breath.  For past few days has been having a stomach bug with nausea, vomiting, and diarrhea.  No blood in her emesis or diarrhea.  Says that today she was throwing up and felt like she could not breathe and started to feel anxious and short of breath.  Also started having chest pain at that time.  Came into the emergency department for evaluation.  Took some of her inhaler to try and help.       Home Medications Prior to Admission medications   Medication Sig Start Date End Date Taking? Authorizing Provider  prochlorperazine (COMPAZINE) 10 MG tablet Take 1 tablet (10 mg total) by mouth 2 (two) times daily as needed for nausea or vomiting. 01/30/23  Yes Pollyann Savoy, MD  albuterol (VENTOLIN HFA) 108 (90 Base) MCG/ACT inhaler Inhale 1-2 puffs into the lungs every 6 (six) hours as needed for wheezing or shortness of breath. 01/16/22   [name], [name], PA-C  amLODipine (NORVASC) 5 MG tablet Take 1 tablet (5 mg total) by mouth daily. 11/21/22 12/21/22  Curatolo, Adam, DO  Phenyleph-CPM-DM-APAP (ALKA-SELTZER PLUS COLD & FLU PO) Take 2 capsules by mouth 2 (two) times daily as needed (cold symptoms).    [provider]  potassium chloride SA (KLOR-CON M) 20 MEQ tablet Take 1 tablet (20 mEq total) by mouth 2 (two) times daily for 7 days. 01/16/22 01/23/22  [name], PA-C  loratadine (CLARITIN) 10 MG tablet Take 1 tablet (10 mg total) by mouth daily. Patient not taking: Reported on 07/03/2017 07/28/16 05/03/19  [name], PA-C       Allergies    Patient has no known allergies.    Review of Systems   Review of Systems  Physical Exam Updated Vital Signs BP (!) 186/126   Pulse 92   Temp 98.1 F (36.7 C) (Oral)   Resp 13   Ht 5\' 2"  (1.575 m)   Wt 72.6 kg   LMP 01/25/2023 (Exact Date)   SpO2 100%   BMI 29.26 kg/m  Physical Exam Vitals and nursing note reviewed.  Constitutional:      General: She is not in acute distress.    Appearance: She is well-developed.     Comments: Very anxious appearing.  Tremor noted.  HENT:     Head: Normocephalic and atraumatic.     Right Ear: External ear normal.     Left Ear: External ear normal.     Nose: Nose normal.  Eyes:     Extraocular Movements: Extraocular movements intact.     Conjunctiva/sclera: Conjunctivae normal.     Pupils: Pupils are equal, round, and reactive to light.  Cardiovascular:     Rate and Rhythm: Regular rhythm. Tachycardia present.     Heart sounds: No murmur heard. Pulmonary:     Effort: Pulmonary effort is normal. No respiratory distress.     Breath sounds: Normal breath sounds.  Abdominal:     General: Abdomen is flat. There is no distension.     Palpations: Abdomen is soft.  There is no mass.     Tenderness: There is no abdominal tenderness. There is no guarding.  Musculoskeletal:     Cervical back: Normal range of motion and neck supple.     Right lower leg: No edema.     Left lower leg: No edema.  Skin:    General: Skin is warm and dry.  Neurological:     Mental Status: She is alert and oriented to person, place, and time. Mental status is at baseline.  Psychiatric:        Mood and Affect: Mood normal.     ED Results / Procedures / Treatments   Labs (all labs ordered are listed, but only abnormal results are displayed) Labs Reviewed  COMPREHENSIVE METABOLIC PANEL - Abnormal; Notable for the following components:      Result Value   Sodium 134 (*)    Potassium 3.0 (*)    Chloride 93 (*)    Glucose, Bld 113 (*)    BUN 5  (*)    Total Protein 8.7 (*)    AST 101 (*)    Anion gap 18 (*)    All other components within normal limits  URINALYSIS, ROUTINE W REFLEX MICROSCOPIC - Abnormal; Notable for the following components:   pH 8.5 (*)    All other components within normal limits  MAGNESIUM - Abnormal; Notable for the following components:   Magnesium 1.5 (*)    All other components within normal limits  D-DIMER, QUANTITATIVE - Abnormal; Notable for the following components:   D-Dimer, Quant 0.76 (*)    All other components within normal limits  LIPASE, BLOOD  CBC  TSH  T4, FREE  HCG, SERUM, QUALITATIVE  TROPONIN I (HIGH SENSITIVITY)    EKG EKG Interpretation Date/Time:  Friday January 29 2023 21:25:15 EDT Ventricular Rate:  104 PR Interval:  176 QRS Duration:  87 QT Interval:  380 QTC Calculation: 500 R Axis:   67  Text Interpretation: Sinus tachycardia Biatrial enlargement Probable left ventricular hypertrophy Borderline prolonged QT interval Baseline wander in lead(s) V4 V5 V6 Confirmed by Vonita Moss [phone]) on 01/29/2023 9:30:59 PM  Radiology CT Angio Chest PE W and/or Wo Contrast  Result Date: 01/30/2023 CLINICAL DATA:  Positive D-dimer.  Nausea and vomiting. EXAM: CT ANGIOGRAPHY CHEST WITH CONTRAST TECHNIQUE: Multidetector CT imaging of the chest was performed using the standard protocol during bolus administration of intravenous contrast. Multiplanar CT image reconstructions and MIPs were obtained to evaluate the vascular anatomy. RADIATION DOSE REDUCTION: This exam was performed according to the departmental dose-optimization program which includes automated exposure control, adjustment of the mA and/or kV according to patient size and/or use of iterative reconstruction technique. CONTRAST:  75mL OMNIPAQUE IOHEXOL 350 MG/ML SOLN COMPARISON:  CT angiogram chest 01/16/2022 FINDINGS: Cardiovascular: Satisfactory opacification of the pulmonary arteries to the segmental level. No evidence of  pulmonary embolism. Normal heart size. No pericardial effusion. Mediastinum/Nodes: No enlarged mediastinal, hilar, or axillary lymph nodes. Thyroid gland, trachea, and esophagus demonstrate no significant findings. Lungs/Pleura: Lungs are clear. No pleural effusion or pneumothorax. Upper Abdomen: No acute abnormality. Musculoskeletal: No chest wall abnormality. No acute or significant osseous findings. Review of the MIP images confirms the above findings. IMPRESSION: No evidence of pulmonary embolism or acute intrathoracic process. Electronically Signed   By: Darliss Cheney M.D.   On: 01/30/2023 01:38   DG Chest 2 View  Result Date: 01/29/2023 CLINICAL DATA:  Shortness of breath and left-sided chest pain. Nausea, vomiting, and diarrhea. EXAM:  CHEST - 2 VIEW COMPARISON:  06/29/2022. FINDINGS: The heart size and mediastinal contours are within normal limits. No consolidation, effusion, or pneumothorax. No acute osseous abnormality. IMPRESSION: No active cardiopulmonary disease. Electronically Signed   By: Thornell Sartorius M.D.   On: 01/29/2023 23:47    Procedures Procedures    Medications Ordered in ED Medications  LORazepam (ATIVAN) injection 1 mg (1 mg Intravenous Given 01/29/23 2120)  lactated ringers bolus 1,000 mL (0 mLs Intravenous Stopped 01/29/23 2314)  hydrALAZINE (APRESOLINE) tablet 25 mg (25 mg Oral Given 01/29/23 2208)  magnesium sulfate IVPB 2 g 50 mL (0 g Intravenous Stopped 01/29/23 2303)  potassium chloride SA (KLOR-CON M) CR tablet 40 mEq (40 mEq Oral Given 01/29/23 2225)  iohexol (OMNIPAQUE) 350 MG/ML injection 75 mL (75 mLs Intravenous Contrast Given 01/29/23 2329)    ED Course/ Medical Decision Making/ A&P Clinical Course as of 01/30/23 1020  Sat Jan 30, 2023  0145 I personally viewed the images from radiology studies and agree with radiologist interpretation: CTA is neg for PE or other acute process. She is feeling better after medications. No further vomiting. BP improving.  She has Rx for HTN at home but does not take it regularly. Encouraged to take her medications daily, Rx for Compazine if needed for nausea. PCP follow up, RTED for any other concerns.   [CS]    Clinical Course User Index [CS] Pollyann Savoy, MD                                 Medical Decision Making Amount and/or Complexity of Data Reviewed Labs: ordered. Radiology: ordered.  Risk Prescription drug management.   Anija Brickner is a 37 y.o. female with comorbidities that complicate the patient evaluation including asthma, hypertension, and PE provoked by COVID not currently on anticoagulation who presents emergency department with nausea, vomiting, diarrhea, anxiety, and chest pain and shortness of breath.   Initial Ddx:  Gastroenteritis, anxiety, aspiration, asthma, PNA, PE, MI, hyperthyroidism  MDM/Course:  Patient presents emergency department with nausea and vomiting and diarrhea.  Also having some chest pain and shortness of breath and anxiety at this time.  On exam is very anxious.  Her lung sounds are clear to auscultation bilaterally.  She is tachycardic and hypertensive which is consistent with her anxiety.  Did send TSH and free T4 which were WNL.  Chest x-ray did not show any acute process such as signs of infiltrate from aspiration.  EKG and initial troponin without any signs of acute ischemia.  Was given Ativan and some IV fluids upon re-evaluation was not feeling anxious anymore but was still tachycardic so D-dimer was added on with her history of PE.  Is now tolerating p.o.  Was elevated and CTA was ordered at this time.  Patient was also found to be hypomagnesemic and was given some IV magnesium.  Signed out to the oncoming physician (Dr. Bernette Mayers) awaiting results of her CT scan.  This patient presents to the ED for concern of complaints listed in HPI, this involves an extensive number of treatment options, and is a complaint that carries with it a high risk of complications  and morbidity. Disposition including potential need for admission considered.   Dispo: Pending remainder of workup  Additional history obtained from daughter Records reviewed Outpatient Clinic Notes The following labs were independently interpreted: Chemistry and show  hypokalemia I independently reviewed the following imaging with scope of  interpretation limited to determining acute life threatening conditions related to emergency care: Chest x-ray and agree with the radiologist interpretation with the following exceptions: none I personally reviewed and interpreted cardiac monitoring: sinus tachycardia I personally reviewed and interpreted the pt's EKG: see above for interpretation  I have reviewed the patients home medications and made adjustments as needed  Portions of this note were generated with Dragon dictation software. Dictation errors may occur despite best attempts at proofreading.           Final Clinical Impression(s) / ED Diagnoses Final diagnoses:  Nausea vomiting and diarrhea  Primary hypertension  Chest pain, unspecified type  Shortness of breath  Hypomagnesemia  Hypokalemia    Rx / DC Orders ED Discharge Orders          Ordered    prochlorperazine (COMPAZINE) 10 MG tablet  2 times daily PRN        01/30/23 0146              Rondel Baton, MD 01/30/23 1020

## 2023-01-29 NOTE — ED Triage Notes (Addendum)
Pt reports have n/v and feeling weak since Monday, reports intermittent ShoB, denies fever, pt appears anxious in triage and states she has been "feeling nervous" today  Denies diarrhea, CP, HA or other sxs

## 2023-01-30 LAB — TSH: TSH: 4.276 u[IU]/mL (ref 0.350–4.500)

## 2023-01-30 LAB — T4, FREE: Free T4: 0.8 ng/dL (ref 0.61–1.12)

## 2023-01-30 MED ORDER — PROCHLORPERAZINE MALEATE 10 MG PO TABS: 10.0000 mg | ORAL_TABLET | Freq: Two times a day (BID) | ORAL | 0 refills | Status: AC | PRN

## 2023-01-30 NOTE — ED Provider Notes (Signed)
Care assumed at shift change. Here for N/V and SOB/anxiety. Labs unremarkable except for dimer, awaiting CTA Physical Exam  BP (!) 186/126   Pulse 92   Temp 98.1 F (36.7 C) (Oral)   Resp 13   Ht 5\' 2"  (1.575 m)   Wt 72.6 kg   LMP 01/25/2023 (Exact Date)   SpO2 100%   BMI 29.26 kg/m   Physical Exam  Procedures  Procedures  ED Course / MDM   Clinical Course as of 01/30/23 0147  Sat Jan 30, 2023  0145 I personally viewed the images from radiology studies and agree with radiologist interpretation: CTA is neg for PE or other acute process. She is feeling better after medications. No further vomiting. BP improving. She has Rx for HTN at home but does not take it regularly. Encouraged to take her medications daily, Rx for Compazine if needed for nausea. PCP follow up, RTED for any other concerns.   [CS]    Clinical Course User Index [CS] Pollyann Savoy, MD   Medical Decision Making Amount and/or Complexity of Data Reviewed Labs: ordered. Radiology: ordered.  Risk Prescription drug management.          Pollyann Savoy, MD 01/30/23 [phone]

## 2023-10-10 ENCOUNTER — Emergency Department (HOSPITAL_BASED_OUTPATIENT_CLINIC_OR_DEPARTMENT_OTHER): Admission: EM | Admit: 2023-10-10 | Discharge: 2023-10-11 | Disposition: A | Discharge status: Home / Self Care

## 2023-10-10 ENCOUNTER — Encounter (HOSPITAL_BASED_OUTPATIENT_CLINIC_OR_DEPARTMENT_OTHER): Payer: Self-pay | Admitting: Emergency Medicine

## 2023-10-10 ENCOUNTER — Other Ambulatory Visit: Payer: Self-pay

## 2023-10-10 DIAGNOSIS — M545 Low back pain, unspecified: Secondary | ICD-10-CM | POA: Insufficient documentation

## 2023-10-10 DIAGNOSIS — Z79899 Other long term (current) drug therapy: Secondary | ICD-10-CM | POA: Diagnosis not present

## 2023-10-10 DIAGNOSIS — I10 Essential (primary) hypertension: Secondary | ICD-10-CM | POA: Diagnosis not present

## 2023-10-10 DIAGNOSIS — R030 Elevated blood-pressure reading, without diagnosis of hypertension: Secondary | ICD-10-CM

## 2023-10-10 LAB — URINALYSIS, MICROSCOPIC (REFLEX)

## 2023-10-10 LAB — URINALYSIS, ROUTINE W REFLEX MICROSCOPIC
Bilirubin Urine: NEGATIVE
Glucose, UA: NEGATIVE mg/dL
Ketones, ur: NEGATIVE mg/dL
Leukocytes,Ua: NEGATIVE
Nitrite: NEGATIVE
Protein, ur: NEGATIVE mg/dL
Specific Gravity, Urine: 1.015 (ref 1.005–1.030)
pH: 7 (ref 5.0–8.0)

## 2023-10-10 LAB — CBC
HCT: 33.3 % — ABNORMAL LOW (ref 36.0–46.0)
Hemoglobin: 11.6 g/dL — ABNORMAL LOW (ref 12.0–15.0)
MCH: 32.7 pg (ref 26.0–34.0)
MCHC: 34.8 g/dL (ref 30.0–36.0)
MCV: 93.8 fL (ref 80.0–100.0)
Platelets: 391 10*3/uL (ref 150–400)
RBC: 3.55 MIL/uL — ABNORMAL LOW (ref 3.87–5.11)
RDW: 14.6 % (ref 11.5–15.5)
WBC: 5.8 10*3/uL (ref 4.0–10.5)
nRBC: 0 % (ref 0.0–0.2)

## 2023-10-10 LAB — HCG, SERUM, QUALITATIVE: Preg, Serum: NEGATIVE

## 2023-10-10 MED ORDER — AMLODIPINE BESYLATE 5 MG PO TABS
5.0000 mg | ORAL_TABLET | Freq: Every day | ORAL | 3 refills | Status: DC
Start: 2023-10-10 — End: 2023-10-10

## 2023-10-10 MED ORDER — METHOCARBAMOL 500 MG PO TABS
500.0000 mg | ORAL_TABLET | Freq: Two times a day (BID) | ORAL | 0 refills | Status: AC
Start: 2023-10-10 — End: ?

## 2023-10-10 MED ORDER — AMLODIPINE BESYLATE 10 MG PO TABS: 10.0000 mg | ORAL_TABLET | Freq: Every day | ORAL | 3 refills | Status: AC

## 2023-10-10 MED ORDER — LIDOCAINE 5 % EX PTCH
2.0000 | MEDICATED_PATCH | CUTANEOUS | Status: DC
  Administered 2023-10-10: 2 via TRANSDERMAL
  Filled 2023-10-10: qty 2

## 2023-10-10 MED ORDER — HYDROCODONE-ACETAMINOPHEN 5-325 MG PO TABS
1.0000 | ORAL_TABLET | Freq: Once | ORAL | Status: AC
  Administered 2023-10-10: 1 via ORAL
  Filled 2023-10-10: qty 1

## 2023-10-10 MED ORDER — HYDROCODONE-ACETAMINOPHEN 5-325 MG PO TABS: 1.0000 | ORAL_TABLET | Freq: Four times a day (QID) | ORAL | 0 refills | Status: AC | PRN

## 2023-10-10 MED ORDER — METHOCARBAMOL 500 MG PO TABS
1000.0000 mg | ORAL_TABLET | Freq: Once | ORAL | Status: AC
  Administered 2023-10-10: 1000 mg via ORAL
  Filled 2023-10-10: qty 2

## 2023-10-10 MED ORDER — LIDOCAINE 5 % EX PTCH: 1.0000 | MEDICATED_PATCH | CUTANEOUS | 0 refills | Status: AC

## 2023-10-10 MED ORDER — KETOROLAC TROMETHAMINE 15 MG/ML IJ SOLN
15.0000 mg | Freq: Once | INTRAMUSCULAR | Status: AC
  Administered 2023-10-10: 15 mg via INTRAVENOUS
  Filled 2023-10-10: qty 1

## 2023-10-10 MED ORDER — NAPROXEN 500 MG PO TABS: 500.0000 mg | ORAL_TABLET | Freq: Two times a day (BID) | ORAL | 0 refills | Status: AC

## 2023-10-10 MED ORDER — PREDNISONE 20 MG PO TABS: 40.0000 mg | ORAL_TABLET | Freq: Every day | ORAL | 0 refills | Status: AC

## 2023-10-10 NOTE — ED Triage Notes (Signed)
 Pt c/o lower back pain that started 1 wk ago; pain is now higher up on back as well; no injury

## 2023-10-10 NOTE — ED Provider Notes (Signed)
 [place] [facility] AT [facility] Provider Note   CSN: [id number] Arrival date & time: 10/10/23  2200     Patient presents with: Back Pain   Ann Mcmillan is a 38 y.o. female.  Patient with past medical history of pulmonary embolism, hypertension presents to emergency room with complaint of back pain.  Patient reports that 1 week ago she had mild back pain that is progressively worsening over the past few days.  I pain is low back and bilateral.  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  She is not tried any specific medications for this.  Denies any urinary symptoms. Denies any injury trauma or fall.  She has no saddle anesthesia, no loss of bowel or bladder, no fever no history of IV drug use no history of cancer.    Back Pain      Prior to Admission medications   Medication Sig Start Date End Date Taking? Authorizing Provider  amLODipine  (NORVASC ) 5 MG tablet Take 1 tablet (5 mg total) by mouth daily. 10/10/23  Yes Rhen Dossantos N, PA-C  albuterol  (VENTOLIN  HFA) 108 (90 Base) MCG/ACT inhaler Inhale 1-2 puffs into the lungs every 6 (six) hours as needed for wheezing or shortness of breath. 01/16/22   Hildegard, [name], PA-C  Phenyleph-CPM-DM-APAP (ALKA-SELTZER PLUS COLD & FLU PO) Take 2 capsules by mouth 2 (two) times daily as needed (cold symptoms).    [provider]  potassium chloride  SA (KLOR-CON  M) 20 MEQ tablet Take 1 tablet (20 mEq total) by mouth 2 (two) times daily for 7 days. 01/16/22 01/23/22  Hildegard Loge, PA-C  prochlorperazine  (COMPAZINE ) 10 MG tablet Take 1 tablet (10 mg total) by mouth 2 (two) times daily as needed for nausea or vomiting. 01/30/23   Roselyn Carlin NOVAK, MD  loratadine  (CLARITIN ) 10 MG tablet Take 1 tablet (10 mg total) by mouth daily. Patient not taking: Reported on 07/03/2017 07/28/16 05/03/19  Keith Sor, PA-C    Allergies: Patient has no known allergies.     Review of Systems  Musculoskeletal:  Positive for back pain.    Updated Vital Signs BP (!) 185/121   Pulse (!) 102   Temp 99 F (37.2 C)   Resp 16   Ht 5' 2 (1.575 m)   Wt 81.6 kg   SpO2 100%   BMI 32.92 kg/m   Physical Exam Vitals and nursing note reviewed.  Constitutional:      General: She is not in acute distress.    Appearance: She is not toxic-appearing.  HENT:     Head: Normocephalic and atraumatic.   Eyes:     General: No scleral icterus.    Conjunctiva/sclera: Conjunctivae normal.    Cardiovascular:     Rate and Rhythm: Normal rate and regular rhythm.     Pulses: Normal pulses.     Heart sounds: Normal heart sounds.  Pulmonary:     Effort: Pulmonary effort is normal. No respiratory distress.     Breath sounds: Normal breath sounds.  Abdominal:     General: Abdomen is flat. Bowel sounds are normal. There is no distension.     Palpations: Abdomen is soft. There is no mass.     Tenderness: There is no abdominal tenderness. There is no right CVA tenderness or left CVA tenderness.     Comments: No CVA tenderness.  Abdomen soft and nondistended.   Musculoskeletal:  Right lower leg: No edema.     Left lower leg: No edema.     Comments: No midline tenderness.  Bilateral lumbar tenderness to palpation over paraspinal musculature.   Skin:    General: Skin is warm and dry.     Findings: No lesion.   Neurological:     General: No focal deficit present.     Mental Status: She is alert and oriented to person, place, and time. Mental status is at baseline.     (all labs ordered are listed, but only abnormal results are displayed) Labs Reviewed  CBC - Abnormal; Notable for the following components:      Result Value   RBC 3.55 (*)    Hemoglobin 11.6 (*)    HCT 33.3 (*)    All other components within normal limits  URINALYSIS, ROUTINE W REFLEX MICROSCOPIC - Abnormal; Notable for the following components:   Hgb urine dipstick MODERATE (*)    All other  components within normal limits  HCG, SERUM, QUALITATIVE  URINALYSIS, MICROSCOPIC (REFLEX)    EKG: EKG Interpretation Date/Time:  Sunday October 10 2023 22:27:57 EDT Ventricular Rate:  119 PR Interval:  132 QRS Duration:  89 QT Interval:  363 QTC Calculation: 511 R Axis:   59  Text Interpretation: Sinus tachycardia Consider left ventricular hypertrophy Prolonged QT interval Baseline wander in lead(s) V2 Confirmed by Ula Barter [phone]) on 10/10/2023 10:30:57 PM  Radiology: No results found.   Procedures   Medications Ordered in the ED  ketorolac  (TORADOL ) 15 MG/ML injection 15 mg (has no administration in time range)  HYDROcodone -acetaminophen  (NORCO/VICODIN) 5-325 MG per tablet 1 tablet (has no administration in time range)  lidocaine  (LIDODERM ) 5 % 2 patch (has no administration in time range)  methocarbamol  (ROBAXIN ) tablet 1,000 mg (has no administration in time range)                                    Medical Decision Making Amount and/or Complexity of Data Reviewed Labs: ordered.  Risk Prescription drug management.   Laveda Molt 38 y.o. presented today for back pain. Working Ddx: MSK in nature, fracture, epidural hematoma/abscess, cauda equina syndrome, spinal stenosis, spinal malignancy, discitis, spinal infection, spondylitises/ spondylosis, conus medullaris, DDD of the back.  R/o DDx: Cauda equina syndrome and additional dx are less likely than current impression due to history of present illness, physical exam, labs/imaging findings. No focal neurological deficits, no loss of bowel or bladder control.  Denies fever, night sweats, weight loss, h/o cancer, IVDU.  PMHX: Past medical history of hypertension.    Review of prior external notes: On prior ED visits patient has had elevated blood pressure systolic readings from 190-210.   Labs: CBC shows no leukocytosis.  No significant anemia.  Pregnancy test is negative.  UA negative for nitrates and negative for  leukocytes.  Imaging: offered, patient declined.   Problem List / ED Course / Critical interventions / Medication management  Presents to emergency room with complaint of low back pain.  This has been ongoing for 1 week.  No red flag symptoms associate with back pain.  Exam shows reproducible tenderness.  She is not having radicular symptoms.  She also denies any dysuria.  Pregnancy test is negative.  Overall reassuring lab work.  Patient is quite hypertensive on arrival with systolic of 215 which is improved after short time of observing in ER and with pain  medications.  She has been out of her blood pressure medications.  She does not want to have a dose before she goes.  I have resent this medication to her pharmacy.  She she has no symptoms consistent with hypertensive emergency. No chest pain, headache, shortness of breath.  Reassessed and has had significant improvement in symptoms after pain medication.  She was discharged in stable condition given return precautions. I ordered medication including cane patch, Robaxin , Norco, Toradol  Reevaluation of the patient after these medicines showed that the patient improved Patients vitals assessed. Upon arrival patient is hemodynamically stable.  I have reviewed the patients home medicines and have made adjustments as needed   Plan: F/u w/ PCP in 2-3d to ensure resolution of sx.  RICE protocol and pain medicine discussed with patient.  Patient was given return precautions. Patient stable for discharge at this time.  Patient educated on sx/dx and verbalized understanding of plan. Return to ER with new or worsening sx.       Final diagnoses:  Elevated blood pressure reading  Acute bilateral low back pain without sciatica    ED Discharge Orders          Ordered    amLODipine  (NORVASC ) 5 MG tablet  Daily        10/10/23 2326               Xavier Munger, Warren SAILOR, PA-C 10/10/23 2359    Ula Prentice SAUNDERS, MD 10/13/23 [phone]

## 2023-10-10 NOTE — Discharge Instructions (Addendum)
 Please obtain blood pressure cuff and monitor blood pressure once daily.  I have sent amlodipine  to your pharmacy please take as prescribed.  For your back pain please take steroid for 5 days.  You can then take naproxen  twice daily.  You can take Tylenol  1000 mg every 8 hours but do not exceed this dose or drink alcohol with.  Take Norco for breakthrough pain.  You can apply lidocaine  patch, ice and heat over area of pain.  Take Robaxin  but do not drive or operate heavy machinery.  Return with worse symptoms.

## 2023-10-11 MED ORDER — AMLODIPINE BESYLATE 5 MG PO TABS
10.0000 mg | ORAL_TABLET | Freq: Once | ORAL | Status: AC
  Administered 2023-10-11: 10 mg via ORAL
  Filled 2023-10-11: qty 2

## 2024-05-13 ENCOUNTER — Encounter (HOSPITAL_BASED_OUTPATIENT_CLINIC_OR_DEPARTMENT_OTHER): Payer: Self-pay

## 2024-05-13 ENCOUNTER — Emergency Department (HOSPITAL_BASED_OUTPATIENT_CLINIC_OR_DEPARTMENT_OTHER)
Admission: EM | Admit: 2024-05-13 | Discharge: 2024-05-14 | Disposition: A | Payer: Self-pay | Attending: Emergency Medicine | Admitting: Emergency Medicine

## 2024-05-13 ENCOUNTER — Other Ambulatory Visit: Payer: Self-pay

## 2024-05-13 DIAGNOSIS — R10A1 Flank pain, right side: Secondary | ICD-10-CM

## 2024-05-13 DIAGNOSIS — Z8616 Personal history of COVID-19: Secondary | ICD-10-CM | POA: Insufficient documentation

## 2024-05-13 DIAGNOSIS — Z79899 Other long term (current) drug therapy: Secondary | ICD-10-CM | POA: Insufficient documentation

## 2024-05-13 DIAGNOSIS — I10 Essential (primary) hypertension: Secondary | ICD-10-CM | POA: Insufficient documentation

## 2024-05-13 DIAGNOSIS — R109 Unspecified abdominal pain: Secondary | ICD-10-CM

## 2024-05-13 DIAGNOSIS — Z7951 Long term (current) use of inhaled steroids: Secondary | ICD-10-CM | POA: Insufficient documentation

## 2024-05-13 DIAGNOSIS — J45909 Unspecified asthma, uncomplicated: Secondary | ICD-10-CM | POA: Insufficient documentation

## 2024-05-13 DIAGNOSIS — D259 Leiomyoma of uterus, unspecified: Secondary | ICD-10-CM | POA: Insufficient documentation

## 2024-05-13 LAB — URINALYSIS, ROUTINE W REFLEX MICROSCOPIC
Bilirubin Urine: NEGATIVE
Glucose, UA: NEGATIVE mg/dL
Hgb urine dipstick: NEGATIVE
Ketones, ur: NEGATIVE mg/dL
Leukocytes,Ua: NEGATIVE
Nitrite: NEGATIVE
Protein, ur: NEGATIVE mg/dL
Specific Gravity, Urine: 1.01 (ref 1.005–1.030)
pH: 6 (ref 5.0–8.0)

## 2024-05-13 LAB — CBC
HCT: 35.6 % — ABNORMAL LOW (ref 36.0–46.0)
Hemoglobin: 12.1 g/dL (ref 12.0–15.0)
MCH: 31.8 pg (ref 26.0–34.0)
MCHC: 34 g/dL (ref 30.0–36.0)
MCV: 93.4 fL (ref 80.0–100.0)
Platelets: 375 10*3/uL (ref 150–400)
RBC: 3.81 MIL/uL — ABNORMAL LOW (ref 3.87–5.11)
RDW: 18 % — ABNORMAL HIGH (ref 11.5–15.5)
WBC: 8.2 10*3/uL (ref 4.0–10.5)
nRBC: 0 % (ref 0.0–0.2)

## 2024-05-13 LAB — COMPREHENSIVE METABOLIC PANEL WITH GFR
ALT: 15 U/L (ref 0–44)
AST: 28 U/L (ref 15–41)
Albumin: 4.3 g/dL (ref 3.5–5.0)
Alkaline Phosphatase: 126 U/L (ref 38–126)
Anion gap: 13 (ref 5–15)
BUN: 7 mg/dL (ref 6–20)
CO2: 25 mmol/L (ref 22–32)
Calcium: 9.6 mg/dL (ref 8.9–10.3)
Chloride: 99 mmol/L (ref 98–111)
Creatinine, Ser: 0.74 mg/dL (ref 0.44–1.00)
GFR, Estimated: >60 mL/min
Glucose, Bld: 95 mg/dL (ref 70–99)
Potassium: 3.2 mmol/L — ABNORMAL LOW (ref 3.5–5.1)
Sodium: 137 mmol/L (ref 135–145)
Total Bilirubin: 0.4 mg/dL (ref 0.0–1.2)
Total Protein: 8.7 g/dL — ABNORMAL HIGH (ref 6.5–8.1)

## 2024-05-13 LAB — PREGNANCY, URINE: Preg Test, Ur: NEGATIVE

## 2024-05-13 MED ORDER — ONDANSETRON HCL 4 MG/2ML IJ SOLN
4.0000 mg | Freq: Once | INTRAMUSCULAR | Status: AC
  Administered 2024-05-13: 4 mg via INTRAVENOUS
  Filled 2024-05-13: qty 2

## 2024-05-13 MED ORDER — FENTANYL CITRATE (PF) 50 MCG/ML IJ SOSY
50.0000 ug | PREFILLED_SYRINGE | Freq: Once | INTRAMUSCULAR | Status: AC
  Administered 2024-05-13: 50 ug via INTRAVENOUS
  Filled 2024-05-13: qty 1

## 2024-05-13 NOTE — ED Notes (Signed)
Ambulated independently to restroom

## 2024-05-13 NOTE — ED Notes (Signed)
 ED Provider at bedside.

## 2024-05-13 NOTE — ED Provider Notes (Signed)
 " [place] [facility] Provider Note   CSN: [id number] Arrival date & time: 05/13/24  2315     Patient presents with: Flank Pain   Ann Mcmillan is a 39 y.o. female.  {Add pertinent medical, surgical, social history, OB history to HPI:32947}  Flank Pain       Prior to Admission medications  Medication Sig Start Date End Date Taking? Authorizing Provider  albuterol  (VENTOLIN  HFA) 108 (90 Base) MCG/ACT inhaler Inhale 1-2 puffs into the lungs every 6 (six) hours as needed for wheezing or shortness of breath. 01/16/22   Hildegard, [name], PA-C  amLODipine  (NORVASC ) 10 MG tablet Take 1 tablet (10 mg total) by mouth daily. 10/10/23   Barrett, Jamie N, PA-C  HYDROcodone -acetaminophen  (NORCO/VICODIN) 5-325 MG tablet Take 1 tablet by mouth every 6 (six) hours as needed for severe pain (pain score 7-10). 10/10/23   Barrett, Jamie N, PA-C  lidocaine  (LIDODERM ) 5 % Place 1 patch onto the skin daily. Remove & Discard patch within 12 hours or as directed by MD 10/10/23   Barrett, Jamie N, PA-C  methocarbamol  (ROBAXIN ) 500 MG tablet Take 1 tablet (500 mg total) by mouth 2 (two) times daily. 10/10/23   Barrett, Warren SAILOR, PA-C  naproxen  (NAPROSYN ) 500 MG tablet Take 1 tablet (500 mg total) by mouth 2 (two) times daily. 10/10/23   Barrett, Jamie N, PA-C  Phenyleph-CPM-DM-APAP (ALKA-SELTZER PLUS COLD & FLU PO) Take 2 capsules by mouth 2 (two) times daily as needed (cold symptoms).    [provider]  potassium chloride  SA (KLOR-CON  M) 20 MEQ tablet Take 1 tablet (20 mEq total) by mouth 2 (two) times daily for 7 days. 01/16/22 01/23/22  Hildegard Loge, PA-C  predniSONE  (DELTASONE ) 20 MG tablet Take 2 tablets (40 mg total) by mouth daily. 10/10/23   Barrett, Jamie N, PA-C  prochlorperazine  (COMPAZINE ) 10 MG tablet Take 1 tablet (10 mg total) by mouth 2 (two) times daily as needed for nausea or vomiting. 01/30/23   Roselyn Carlin NOVAK, MD  loratadine  (CLARITIN ) 10 MG tablet Take 1  tablet (10 mg total) by mouth daily. Patient not taking: Reported on 07/03/2017 07/28/16 05/03/19  Keith Sor, PA-C    Allergies: Patient has no known allergies.    Review of Systems  Genitourinary:  Positive for flank pain.    Updated Vital Signs BP (!) 223/127   Pulse (!) 106   Temp 97.8 F (36.6 C) (Oral)   Resp 20   Ht 5' 2 (1.575 m)   Wt 77.1 kg   SpO2 100%   BMI 31.09 kg/m   Physical Exam  (all labs ordered are listed, but only abnormal results are displayed) Labs Reviewed  URINALYSIS, ROUTINE W REFLEX MICROSCOPIC  PREGNANCY, URINE  CBC  COMPREHENSIVE METABOLIC PANEL WITH GFR    EKG: None  Radiology: No results found.  {Document cardiac monitor, telemetry assessment procedure when appropriate:32947} Procedures   Medications Ordered in the ED - No data to display    {Click here for ABCD2, HEART and other calculators REFRESH Note before signing:1}                              Medical Decision Making Amount and/or Complexity of Data Reviewed Labs: ordered.   ***  {Document critical care time when appropriate  Document review of labs and clinical decision tools ie CHADS2VASC2, etc  Document your independent review of radiology images and  any outside records  Document your discussion with family members, caretakers and with consultants  Document social determinants of health affecting pt's care  Document your decision making why or why not admission, treatments were needed:32947:::1}   Final diagnoses:  None    ED Discharge Orders     None        "

## 2024-05-13 NOTE — ED Triage Notes (Addendum)
 Pt presents via POV c/o right sided flank/back pain starting on Thursday. Reports pain has started to radiate into her RLQ of abd. Denies emesis or diarrhea. + nausea. A&O x4. Reports hx of HTN, reports compliance with medication.

## 2024-05-14 ENCOUNTER — Emergency Department (HOSPITAL_BASED_OUTPATIENT_CLINIC_OR_DEPARTMENT_OTHER): Payer: Self-pay

## 2024-05-14 LAB — MAGNESIUM: Magnesium: 1.8 mg/dL (ref 1.7–2.4)

## 2024-05-14 MED ORDER — LIDOCAINE 5 % EX PTCH
1.0000 | MEDICATED_PATCH | CUTANEOUS | Status: DC
  Administered 2024-05-14: 1 via TRANSDERMAL
  Filled 2024-05-14: qty 1

## 2024-05-14 MED ORDER — KETOROLAC TROMETHAMINE 10 MG PO TABS: 10.0000 mg | ORAL_TABLET | Freq: Four times a day (QID) | ORAL | 0 refills | Status: AC | PRN

## 2024-05-14 MED ORDER — IOHEXOL 300 MG/ML  SOLN
80.0000 mL | Freq: Once | INTRAMUSCULAR | Status: AC | PRN
  Administered 2024-05-14: 80 mL via INTRAVENOUS

## 2024-05-14 MED ORDER — KETOROLAC TROMETHAMINE 15 MG/ML IJ SOLN
15.0000 mg | Freq: Once | INTRAMUSCULAR | Status: AC
  Administered 2024-05-14: 15 mg via INTRAVENOUS
  Filled 2024-05-14: qty 1

## 2024-05-14 MED ORDER — OXYCODONE-ACETAMINOPHEN 5-325 MG PO TABS: 1.0000 | ORAL_TABLET | Freq: Four times a day (QID) | ORAL | 0 refills | Status: AC | PRN

## 2024-05-14 MED ORDER — CYCLOBENZAPRINE HCL 5 MG PO TABS
5.0000 mg | ORAL_TABLET | Freq: Once | ORAL | Status: AC
  Administered 2024-05-14: 5 mg via ORAL
  Filled 2024-05-14: qty 1

## 2024-05-14 MED ORDER — POTASSIUM CHLORIDE CRYS ER 20 MEQ PO TBCR
40.0000 meq | EXTENDED_RELEASE_TABLET | Freq: Once | ORAL | Status: AC
  Administered 2024-05-14: 40 meq via ORAL
  Filled 2024-05-14: qty 2

## 2024-05-14 NOTE — Discharge Instructions (Addendum)
 Your CT scan was overall reassuring with the exception of the finding of multiple uterine fibroids which can cause abnormal uterine bleeding as well as abdominal pain/pelvic pain.  Recommend you follow-up with an OB/GYN to discuss further outpatient management, return to the Emergency Department for any severe worsening pain.

## 2024-05-14 NOTE — ED Notes (Signed)
 Patient transported to CT
